# Patient Record
Sex: Female | Born: 1975 | Race: White | Hispanic: No | Marital: Married | Smoking: Never smoker
Health system: Southern US, Community
[De-identification: ages and names within clinical notes are randomized; demographics above are authoritative.]

## PROBLEM LIST (undated history)

## (undated) DIAGNOSIS — Z8041 Family history of malignant neoplasm of ovary: Secondary | ICD-10-CM

## (undated) DIAGNOSIS — O34219 Maternal care for unspecified type scar from previous cesarean delivery: Secondary | ICD-10-CM

## (undated) DIAGNOSIS — R87619 Unspecified abnormal cytological findings in specimens from cervix uteri: Secondary | ICD-10-CM

## (undated) DIAGNOSIS — D649 Anemia, unspecified: Secondary | ICD-10-CM

## (undated) DIAGNOSIS — IMO0002 Reserved for concepts with insufficient information to code with codable children: Secondary | ICD-10-CM

## (undated) DIAGNOSIS — Z803 Family history of malignant neoplasm of breast: Secondary | ICD-10-CM

## (undated) HISTORY — DX: Family history of malignant neoplasm of ovary: Z80.41

## (undated) HISTORY — PX: WISDOM TOOTH EXTRACTION: SHX21

## (undated) HISTORY — PX: LEEP: SHX91

## (undated) HISTORY — PX: DILATION AND CURETTAGE OF UTERUS: SHX78

## (undated) HISTORY — DX: Family history of malignant neoplasm of breast: Z80.3

---

## 1998-11-02 ENCOUNTER — Ambulatory Visit (HOSPITAL_BASED_OUTPATIENT_CLINIC_OR_DEPARTMENT_OTHER): Admission: RE | Admit: 1998-11-02 | Discharge: 1998-11-02 | Payer: Self-pay | Admitting: Plastic Surgery

## 2000-01-08 ENCOUNTER — Other Ambulatory Visit: Admission: RE | Admit: 2000-01-08 | Discharge: 2000-01-08 | Payer: Self-pay | Admitting: *Deleted

## 2000-01-09 ENCOUNTER — Encounter (INDEPENDENT_AMBULATORY_CARE_PROVIDER_SITE_OTHER): Payer: Self-pay

## 2000-01-09 ENCOUNTER — Other Ambulatory Visit: Admission: RE | Admit: 2000-01-09 | Discharge: 2000-01-09 | Payer: Self-pay | Admitting: Obstetrics and Gynecology

## 2000-02-15 ENCOUNTER — Encounter (INDEPENDENT_AMBULATORY_CARE_PROVIDER_SITE_OTHER): Payer: Self-pay

## 2000-02-15 ENCOUNTER — Other Ambulatory Visit: Admission: RE | Admit: 2000-02-15 | Discharge: 2000-02-15 | Payer: Self-pay | Admitting: *Deleted

## 2006-04-29 HISTORY — PX: DILATION AND CURETTAGE OF UTERUS: SHX78

## 2007-02-12 ENCOUNTER — Ambulatory Visit (HOSPITAL_COMMUNITY): Admission: RE | Admit: 2007-02-12 | Discharge: 2007-02-12 | Payer: Self-pay | Admitting: Obstetrics and Gynecology

## 2007-02-12 ENCOUNTER — Encounter (INDEPENDENT_AMBULATORY_CARE_PROVIDER_SITE_OTHER): Payer: Self-pay | Admitting: Obstetrics and Gynecology

## 2007-06-10 ENCOUNTER — Ambulatory Visit (HOSPITAL_COMMUNITY): Admission: RE | Admit: 2007-06-10 | Discharge: 2007-06-10 | Payer: Self-pay | Admitting: Obstetrics and Gynecology

## 2008-04-13 ENCOUNTER — Inpatient Hospital Stay (HOSPITAL_COMMUNITY): Admission: AD | Admit: 2008-04-13 | Discharge: 2008-04-15 | Payer: Self-pay | Admitting: Obstetrics and Gynecology

## 2010-04-29 NOTE — L&D Delivery Note (Signed)
Delivery Note At 3:57 AM a viable and healthy female was delivered via Vaginal, Spontaneous Delivery (Presentation: Right Occiput Anterior).  APGAR: 8, 9; weight .   Placenta status: Intact, Pathology.  Cord: 3 vessels with the following complications: None.  Cord pH: none. CAN x 1 reducible. Light  mec fluid.   Anesthesia: Local  Episiotomy: None Lacerations: 3rd degree Suture Repair: 3.0 chromic vicryl O x 4 Est. Blood Loss (mL): 250cc  Mom to postpartum.  Baby to nursery-stable.  Antanisha Mohs A 12/16/2010, 4:34 AM

## 2010-05-16 ENCOUNTER — Other Ambulatory Visit: Payer: Self-pay | Admitting: Obstetrics and Gynecology

## 2010-06-29 IMAGING — CR DG CYSTOGRAM 3+V
1 series · 1 of 1 positions shown · non-contrast
Comparison: None

CLINICAL DATA: Post C-section on 04/13/2008.  Evaluate for bladder
leak.

CYSTOURETHROGRAM
TECHNIQUE: After catheterization of the urinary bladder following
sterile technique the bladder was filled with 300 cc Cysto-hypaque
30% by drip infusion.  Serial spot images were obtained during
bladder filling .
Fluoroscopy Time: 4.3 minutes

[view not recorded]
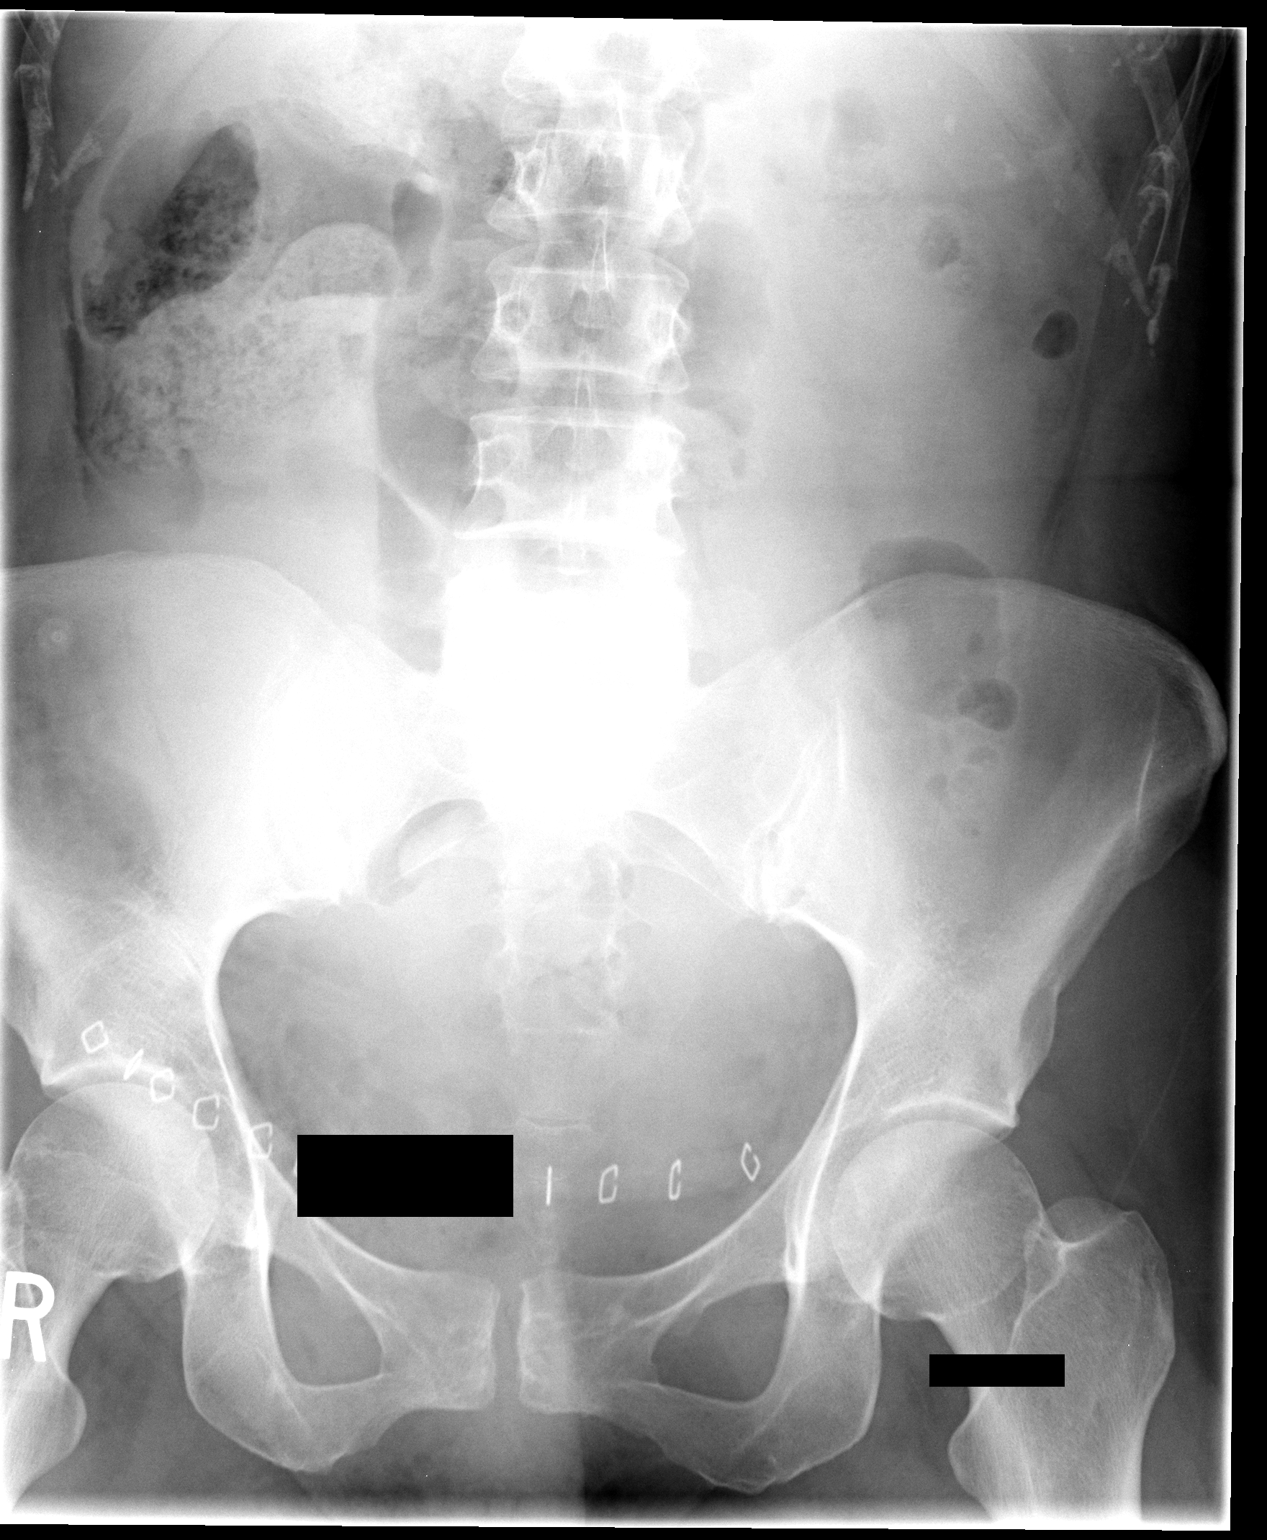

[1 of 1 positions shown; findings below may reference images not displayed]

FINDINGS: On the initial scout film, surgical clips are identified
over the lower pelvis compatible the patient's recent cesarean
section.  No abnormal calcifications or focal bony abnormalities
are seen.

Early filling and late filling films of the bladder reveals a
normal bladder contour.  A small amount of air was introduced into
the bladder during the installation process.  No evidence for
contrast leakage was seen with the patient experiencing a sensation
of fullness at a capacity of 300 ml.  No evidence for contrast was
seen within the pelvis after passive drainage of the bladder
through the instilled catheter to suggest an unrecognized area of
leakage. Complete emptying of the bladder was achieved.
IMPRESSION: Normal cystogram with no evidence for leakage from the bladder.

## 2010-09-11 NOTE — Op Note (Signed)
[name], [name]              ACCOUNT NO.:  [id number]   MEDICAL RECORD NO.:  [id number]          PATIENT TYPE:  INP   LOCATION:  9199                          FACILITY:  [facility]   PHYSICIAN:  Maxie Better, M.D.DATE OF BIRTH:  [birth year]   DATE OF PROCEDURE:  04/13/2008  DATE OF DISCHARGE:                               OPERATIVE REPORT   PREOPERATIVE DIAGNOSIS:  Arrest of descent, postdates.   PROCEDURES:  1. Primary cesarean section, Kerr hysterotomy.  2. Cystoscopy by urologist.   POSTOPERATIVE DIAGNOSES:  1. Arrest of descent, right occiput posterior presentation, postdates.  2. Bladder contusion.   ANESTHESIA:  Epidural.   SURGEON:  Maxie Better, MD   ASSISTANT:  None.   INTRAOPERATIVE CONSULT:  Scott A. MacDiarmid, MD   PROCEDURE:  Under adequate epidural anesthesia, the patient was placed  in a supine position with a left lateral tilt and indwelling Foley  catheter was replaced in the operating room.  A slight blood tinged  urine was noted at that time.  The patient was sterilely prepped and  draped in the usual fashion.  A 0.25% Marcaine was injected along the  planned Pfannenstiel skin incision site.  Pfannenstiel skin incision was  then made, carried down to the rectus fascia.  Rectus fascia was opened  transversely.  Rectus fascia was then bluntly and sharply dissected off  the rectus muscle in superior and inferior fashion.  The rectus muscle  was split in the midline.  The parietal peritoneum was then opened  sharply and extended.  There was some edema in the lower uterine  segment.  The bladder flap was then opened transversely.  The bladder  had minimal descent.  A curvilinear low transverse uterine incision was  then made and extended with bandage scissors.  The baby was large in the  pelvis.  After dislodging from its presentation, vacuum was then used to  help to assist with the delivery.  Baby was bulb suctioned.  The  abdominal cord was  clamped and cut.  The baby was transferred to the  awaiting pediatrician with assigned Apgars of 9 and 9 at 1 and 5  minutes.  The uterus was then exteriorized.  The uterine incision had an  extension at the left angle inferiorly with active bleeding noted.  Sutures of 0 Monocryl was placed, and the incision then closed with 0  Monocryl running locked stitch first layer and second layer was  imbricated using 0 Monocryl suture.  While the second layer was being  closed, it was then noted that there was gross bloody urine from the  catheter.  At that point given the location of the extension on the  left, decision was made to have an intraoperative consultation with the  Urology, Dr. Sherron Monday was the on-call physician for that practice.  Normal tubes and ovaries were identified.  Indigo carmine was injected  intravenously while waiting for his presence.  The blue dye was noted in  the bladder catheter and none was noted in the abdominal field.  Dr.  Sherron Monday arrived and performed a cystoscopy and evaluated abdominally  as  well.  Please see his dictation for the specific details.  After  realizing that the bleeding appears to from the midline contusion of the  bladder from prolonged pushing, the uterus was then returned to the  abdomen.  The abdomen was then copiously irrigated.  The bladder had  been distended prior to closure and no leakage of second dose of indigo  carmine was noted in the abdomen at all.  The paracolic gutters were  then cleaned of debris.  Abdomen was copiously irrigated and suctioned.  Parietal peritoneum was closed with 2-0 Vicryl.  The rectus fascia was  closed with 0 Vicryl x2.  The subcutaneous areas irrigated, small  bleeders cauterized, interrupted 2-0 plain sutures placed, and the skin  approximated using Ethicon staples.   SPECIMENS:  Placenta not sent to pathology.   ESTIMATED BLOOD LOSS:  800 mL.   URINE OUTPUT:  500 mL.   INTRAOPERATIVE FLUID:  2800  mL.   COUNTS:  Sponge and instrument counts x2 was correct.   COMPLICATION:  None.   DISPOSITION:  The patient tolerated the procedure well, was transferred  to the recovery in stable condition.   Weight of the baby was 9 pounds 10 ounces.      Maxie Better, M.D.  Electronically Signed     [place]/MEDQ  D:  04/13/2008  T:  04/14/2008  Job:  [id number]

## 2010-09-11 NOTE — Op Note (Signed)
[name], [name]              ACCOUNT NO.:  [id number]   MEDICAL RECORD NO.:  [id number]          PATIENT TYPE:  INP   LOCATION:  9165                          FACILITY:  [facility]   PHYSICIAN:  Martina Sinner, MD DATE OF BIRTH:  12-06-1975   DATE OF PROCEDURE:  04/13/2008  DATE OF DISCHARGE:                               OPERATIVE REPORT   PREOPERATIVE DIAGNOSIS:  Gross hematuria.   POSTOPERATIVE DIAGNOSIS:  Bladder contusion.   SURGERY:  Cystoscopy and inspection of bladder and pelvis for urine  leak.   [name] had a cesarean section by Dr. Maxie Better who  called me because there was gross blood in the catheter.  Her dissection  when she did the cesarean section took her a little bit lateral on the  left side, and she was concerned she may have injured the left ureter.   When I entered the operating room, the patient was stable and awake and  utilizing a spinal anesthetic.  The uterus was delivered out through her  small Pfannenstiel incision.  The bladder was easily palpable and  visualized in the retropubic space.  There was no obvious injury, and I  could locate the Foley catheter balloon.   Dr. Cherly Hensen showed me the area on the left side that she was a little  bit concerned of, and in my opinion, it looked like the uterine artery,  and I felt that the ureter would have been much deeper in the pelvis.  There was no blue dye in the left hemi pelvis, and indigo carmine had  been given 20 minutes prior.   We took time, and in a sterile technique, the patient was placed in  lithotomy position, and I prepped her vaginal area and draped her for  cystoscopy.   A 20-French scope with 3-degree lens was utilized.  Under direct  visualization, one could see a midline elevation almost like a camel  hump with was I thought were two ureteral orifices, one on each side  with edema of the mucosa in the midline and around the ureters.  The  rest the mucosa looked  normal.  It was an odd finding, and I felt was  likely due to the baby pushing and trying to be delivered vaginally.   More indigo carmine was given, and bilateral ureteral jets were seen  through the 2 areas in question.  There was no other injury.  She had a  long urethra, though it was capacious, and she leaked under anesthesia.  There was no palpable or visual injury to the bladder or urethra  transvaginally.   I then inspected the bladder abdominally, and again there was no obvious  leak or blue dye in the retropubic space or deep in the left hemi  pelvis.   My working diagnosis was contusion of the bladder.  I placed a 20-French  catheter just in case she had bleeding postprocedure.  We will low-  volume cystogram tomorrow or the next day to rule out an occult bladder  leak.   In my opinion, the patient did not require further identification  of the  ureter which would meant extending the incision and a lot more surgery  because of the anatomic findings.           ______________________________  Martina Sinner, MD  Electronically Signed     SAM/MEDQ  D:  04/13/2008  T:  04/15/2008  Job:  [id number]

## 2010-09-11 NOTE — Op Note (Signed)
NAMEALMADELIA, [name]              ACCOUNT NO.:  [id number]   MEDICAL RECORD NO.:  [id number]          PATIENT TYPE:  AMB   LOCATION:  SDC                           [facility]ACILITY:  [facility]   PHYSICIAN:  Maxie Better, M.D.DATE OF BIRTH:  [birth year]   DATE OF PROCEDURE:  02/12/2007  DATE OF DISCHARGE:                               OPERATIVE REPORT   PREOPERATIVE DIAGNOSIS:  Missed abortion.   PROCEDURE:  Ultrasound-guided suction dilation and evacuation.   POSTOPERATIVE DIAGNOSIS:  Missed abortion.   ANESTHESIA:  MAC paracervical block.   SURGEON:  Maxie Better, M.D.   DESCRIPTION OF PROCEDURE:  Under adequate monitored anesthesia, the  patient is placed in the dorsal lithotomy position.  She was sterilely  prepped and draped in the usual fashion.  Bladder was not catheterized  due to the planned ultrasound to facilitate the procedure.  Examination  under anesthesia revealed about an 8-week size uterus.  A bivalve  speculum was placed in the vagina.  Then 20 mL of 1% Nesacaine was  injected paracervically at 3 and 9 o'clock.  The anterior lip of the  cervix was grasped with a single-tooth tenaculum.  The cervix was then  serially dilated up to #27 [facility] dilator.  A number 7 mm curved suction  cannula was introduced into the uterine cavity under ultrasound-  guidance.   The cavity which had evidence of a fluid-filled sac was then suctioned,  and subsequently curetted until all tissue was felt to be removed, at  which time all instruments were then removed from the vagina.  Specimen  labeled products of conception were sent to pathology.  Estimated blood  loss was minimal.  Complications were none.  Maternal blood type is O+.  The patient tolerated the procedure well, and was transferred to  recovery room in stable condition.      Maxie Better, M.D.  Electronically Signed     [place]/MEDQ  D:  02/12/2007  T:  02/13/2007  Job:  [id number]

## 2010-09-14 NOTE — Discharge Summary (Signed)
[name], [name]              ACCOUNT NO.:  [id number]   MEDICAL RECORD NO.:  [id number]          PATIENT TYPE:  INP   LOCATION:  9133                          [facility]ACILITY:  [facility]   PHYSICIAN:  Maxie Better, M.D.DATE OF BIRTH:  Apr 07, 1976   DATE OF ADMISSION:  04/13/2008  DATE OF DISCHARGE:  04/15/2008                               DISCHARGE SUMMARY   ADMISSION DIAGNOSIS:  Early labor, post dates.   DISCHARGE DIAGNOSES:  Post dates delivered, arrest of descent, bladder  contusion, postoperative anemia.   PROCEDURE:  Primary cesarean section, Kerr hysterotomy, cystoscopy by  Dr. Jacquelyne Balint.   HISTORY OF PRESENT ILLNESS:  A 35 year old gravida 4, para 0-0-3-0  married white female at 14-6/7th weeks admitted on April 13, 2008, in  early labor.  The patient was already scheduled for induction of labor  later that night.  She presented to maternity admissions with complaints  of contractions.  The tracing was reactive.  Her exam was 160%.  She  then ambulated and progressive 3 cm, 85%, -1 per the RN.  The prenatal  course was otherwise unremarkable.  Ultrasound had shown estimated fetal  weight of 9 pounds 3 ounces and normal amniotic fluid.  The patient had  had a normal one-hour glucose tolerance test.   HOSPITAL COURSE:  The patient was admitted to the labor unit.  She had  contractions every 2-4 minutes.  She progressed to 5, 90%, -1.  She  subsequently had artificial rupture of membranes with clear fluid.  Exam  was thought to be right occiput posterior presentation with some  asynclitism.  At that time, she was 690, -1, tracing was reactive,  contractions were irregular.  She had a protracted/arrest of dilatation.  The patient had Pitocin augmentation.  Intrauterine pressure catheter  placed and she was placed in exaggerated Cecilio Asper' position.  The patient  subsequently progressed to full dilatation.  She was +2 station with a  small caput, contractions every 1-1/2 to 2  minutes.  The patient had  been on penicillin for group B strep positive and she continued to push.  The patient pushed for 3 hours, the catheter remained unchanged.  The  patient was not amenable to vaginal delivery and she was having deep  variable deceleration with pushing.  Decision was made to proceed with a  primary cesarean section.  The patient was therefore taken to the  operating room.  Please see the dictated operative report for specific  details.  The patient delivered a live female, right occiput posterior  presentation, 9 pounds 10 ounces, normal tubes and ovaries, Apgars of 9  and 9.  During the time of the surgery, gross hematuria was noted, which  resulted in an intraoperative consultation with urologist on-call Dr.  Jacquelyne Balint, who kindly came in and evaluated the patient including a  cystoscopy.  He has the dictated operative report as well.  The findings  were consistent with bladder contusion due to the pushing for the  prolonged period of time.  Ureteral jets were noted bilaterally.  The  patient subsequently had postoperatively cystogram that was normal and  her Foley catheter was subsequently discontinued.  She continued to do  well thereafter.  Her CBC on postop day #1 showed a hemoglobin of 10.6,  hematocrit of 30.4, white count of 27.5, platelet count 236,000.  The  patient remained afebrile throughout her course.  Her had no evidence of  an infection and she desired to go home.   DISPOSITION:  Home.   CONDITION:  Stable.   DISCHARGE MEDICATIONS:  1. Percocet 1-2 tablets every 4-6 hours p.r.n. pain.  2. Motrin 800 mg every 8 hours p.r.n. pain.  3. Prenatal vitamins one p.o. daily.   FOLLOWUP:  Followup appointment at [facility] for staple removal on  Monday or Tuesday and for routine 6 weeks postpartum appointment in 6  weeks.  Discharge instructions per the postpartum booklet given.      Maxie Better, M.D.  Electronically Signed      [place]/MEDQ  D:  05/15/2008  T:  05/15/2008  Job:  [id number]

## 2010-12-16 ENCOUNTER — Encounter (HOSPITAL_COMMUNITY): Payer: Self-pay | Admitting: Anesthesiology

## 2010-12-16 ENCOUNTER — Inpatient Hospital Stay (HOSPITAL_COMMUNITY)
Admission: AD | Admit: 2010-12-16 | Discharge: 2010-12-17 | DRG: 373 | Disposition: A | Discharge status: Home / Self Care | Payer: BC Managed Care – PPO | Source: Ambulatory Visit | Attending: Obstetrics and Gynecology | Admitting: Obstetrics and Gynecology

## 2010-12-16 ENCOUNTER — Encounter (HOSPITAL_COMMUNITY): Payer: Self-pay | Admitting: *Deleted

## 2010-12-16 ENCOUNTER — Other Ambulatory Visit: Payer: Self-pay | Admitting: Obstetrics and Gynecology

## 2010-12-16 DIAGNOSIS — IMO0001 Reserved for inherently not codable concepts without codable children: Secondary | ICD-10-CM

## 2010-12-16 DIAGNOSIS — O99892 Other specified diseases and conditions complicating childbirth: Secondary | ICD-10-CM | POA: Diagnosis present

## 2010-12-16 DIAGNOSIS — O34219 Maternal care for unspecified type scar from previous cesarean delivery: Secondary | ICD-10-CM | POA: Diagnosis present

## 2010-12-16 DIAGNOSIS — Z22330 Carrier of Group B streptococcus: Secondary | ICD-10-CM

## 2010-12-16 HISTORY — DX: Anemia, unspecified: D64.9

## 2010-12-16 HISTORY — DX: Reserved for concepts with insufficient information to code with codable children: IMO0002

## 2010-12-16 HISTORY — DX: Unspecified abnormal cytological findings in specimens from cervix uteri: R87.619

## 2010-12-16 LAB — RPR
RPR Ser Ql: NONREACTIVE
RPR: NONREACTIVE

## 2010-12-16 LAB — CBC
HCT: 37.8 % (ref 36.0–46.0)
MCH: 31.9 pg (ref 26.0–34.0)
MCHC: 34.9 g/dL (ref 30.0–36.0)
RDW: 12.9 % (ref 11.5–15.5)

## 2010-12-16 LAB — HEPATITIS B SURFACE ANTIGEN: Hepatitis B Surface Ag: NEGATIVE

## 2010-12-16 LAB — STREP B DNA PROBE: GBS: POSITIVE

## 2010-12-16 LAB — ANTIBODY SCREEN: Antibody Screen: NEGATIVE

## 2010-12-16 MED ORDER — DIPHENHYDRAMINE HCL 25 MG PO CAPS: 25.0000 mg | ORAL_CAPSULE | Freq: Four times a day (QID) | ORAL | Status: DC | PRN

## 2010-12-16 MED ORDER — LANOLIN HYDROUS EX OINT: TOPICAL_OINTMENT | CUTANEOUS | Status: DC | PRN

## 2010-12-16 MED ORDER — EPHEDRINE 5 MG/ML INJ
10.0000 mg | INTRAVENOUS | Status: DC | PRN
  Filled 2010-12-16: qty 4

## 2010-12-16 MED ORDER — ACETAMINOPHEN 325 MG PO TABS: 650.0000 mg | ORAL_TABLET | ORAL | Status: DC | PRN

## 2010-12-16 MED ORDER — OXYCODONE-ACETAMINOPHEN 5-325 MG PO TABS: 1.0000 | ORAL_TABLET | ORAL | Status: DC | PRN

## 2010-12-16 MED ORDER — BENZOCAINE-MENTHOL 20-0.5 % EX AERO
INHALATION_SPRAY | CUTANEOUS | Status: AC
  Filled 2010-12-16: qty 56

## 2010-12-16 MED ORDER — IBUPROFEN 600 MG PO TABS: 600.0000 mg | ORAL_TABLET | Freq: Four times a day (QID) | ORAL | Status: DC | PRN

## 2010-12-16 MED ORDER — IBUPROFEN 600 MG PO TABS
600.0000 mg | ORAL_TABLET | Freq: Four times a day (QID) | ORAL | Status: DC
  Administered 2010-12-16 – 2010-12-17 (×6): 600 mg via ORAL
  Filled 2010-12-16 (×6): qty 1

## 2010-12-16 MED ORDER — PRENATAL PLUS 27-1 MG PO TABS
1.0000 | ORAL_TABLET | Freq: Every day | ORAL | Status: DC
  Administered 2010-12-16 – 2010-12-17 (×2): 1 via ORAL
  Filled 2010-12-16 (×2): qty 1

## 2010-12-16 MED ORDER — PHENYLEPHRINE 40 MCG/ML (10ML) SYRINGE FOR IV PUSH (FOR BLOOD PRESSURE SUPPORT)
80.0000 ug | PREFILLED_SYRINGE | INTRAVENOUS | Status: DC | PRN
  Filled 2010-12-16: qty 5

## 2010-12-16 MED ORDER — ZOLPIDEM TARTRATE 5 MG PO TABS: 5.0000 mg | ORAL_TABLET | Freq: Every evening | ORAL | Status: DC | PRN

## 2010-12-16 MED ORDER — PENICILLIN G POTASSIUM 5000000 UNITS IJ SOLR
2.5000 10*6.[IU] | INTRAVENOUS | Status: DC
  Filled 2010-12-16 (×3): qty 2.5

## 2010-12-16 MED ORDER — LACTATED RINGERS IV SOLN
500.0000 mL | Freq: Once | INTRAVENOUS | Status: AC
  Administered 2010-12-16: 1000 mL via INTRAVENOUS

## 2010-12-16 MED ORDER — PHENYLEPHRINE 40 MCG/ML (10ML) SYRINGE FOR IV PUSH (FOR BLOOD PRESSURE SUPPORT): 80.0000 ug | PREFILLED_SYRINGE | INTRAVENOUS | Status: DC | PRN

## 2010-12-16 MED ORDER — ONDANSETRON HCL 4 MG/2ML IJ SOLN: 4.0000 mg | Freq: Four times a day (QID) | INTRAMUSCULAR | Status: DC | PRN

## 2010-12-16 MED ORDER — PENICILLIN G POTASSIUM 5000000 UNITS IJ SOLR
5.0000 10*6.[IU] | Freq: Once | INTRAVENOUS | Status: DC
  Administered 2010-12-16: 5 10*6.[IU] via INTRAVENOUS
  Filled 2010-12-16: qty 5

## 2010-12-16 MED ORDER — CITRIC ACID-SODIUM CITRATE 334-500 MG/5ML PO SOLN
30.0000 mL | ORAL | Status: DC | PRN
  Administered 2010-12-16: 30 mL via ORAL
  Filled 2010-12-16: qty 15

## 2010-12-16 MED ORDER — DIBUCAINE 1 % RE OINT
1.0000 "application " | TOPICAL_OINTMENT | RECTAL | Status: DC | PRN
  Administered 2010-12-16: 1 via RECTAL
  Filled 2010-12-16 (×2): qty 28

## 2010-12-16 MED ORDER — DIPHENHYDRAMINE HCL 50 MG/ML IJ SOLN: 12.5000 mg | INTRAMUSCULAR | Status: DC | PRN

## 2010-12-16 MED ORDER — OXYTOCIN 20 UNITS IN LACTATED RINGERS INFUSION - SIMPLE
125.0000 mL/h | INTRAVENOUS | Status: DC
  Administered 2010-12-16: 500 mL/h via INTRAVENOUS

## 2010-12-16 MED ORDER — OXYTOCIN BOLUS FROM INFUSION
500.0000 mL | Freq: Once | INTRAVENOUS | Status: DC
  Filled 2010-12-16: qty 500
  Filled 2010-12-16: qty 1000

## 2010-12-16 MED ORDER — LIDOCAINE HCL (PF) 1 % IJ SOLN
30.0000 mL | INTRAMUSCULAR | Status: DC | PRN
  Administered 2010-12-16: 30 mL via SUBCUTANEOUS
  Filled 2010-12-16: qty 30

## 2010-12-16 MED ORDER — LACTATED RINGERS IV SOLN: 500.0000 mL | INTRAVENOUS | Status: DC | PRN

## 2010-12-16 MED ORDER — ONDANSETRON HCL 4 MG/2ML IJ SOLN: 4.0000 mg | INTRAMUSCULAR | Status: DC | PRN

## 2010-12-16 MED ORDER — SENNOSIDES-DOCUSATE SODIUM 8.6-50 MG PO TABS: 2.0000 | ORAL_TABLET | Freq: Every day | ORAL | Status: DC

## 2010-12-16 MED ORDER — WITCH HAZEL-GLYCERIN EX PADS
1.0000 "application " | MEDICATED_PAD | CUTANEOUS | Status: DC | PRN
  Administered 2010-12-16: 1 via TOPICAL

## 2010-12-16 MED ORDER — FENTANYL 2.5 MCG/ML BUPIVACAINE 1/10 % EPIDURAL INFUSION (WH - ANES)
14.0000 mL/h | INTRAMUSCULAR | Status: DC
  Filled 2010-12-16: qty 60

## 2010-12-16 MED ORDER — OXYCODONE-ACETAMINOPHEN 5-325 MG PO TABS: 2.0000 | ORAL_TABLET | ORAL | Status: DC | PRN

## 2010-12-16 MED ORDER — FLEET ENEMA 7-19 GM/118ML RE ENEM: 1.0000 | ENEMA | RECTAL | Status: DC | PRN

## 2010-12-16 MED ORDER — EPHEDRINE 5 MG/ML INJ: 10.0000 mg | INTRAVENOUS | Status: DC | PRN

## 2010-12-16 MED ORDER — BENZOCAINE-MENTHOL 20-0.5 % EX AERO
1.0000 "application " | INHALATION_SPRAY | CUTANEOUS | Status: DC | PRN
  Filled 2010-12-16: qty 56

## 2010-12-16 MED ORDER — SIMETHICONE 80 MG PO CHEW: 80.0000 mg | CHEWABLE_TABLET | ORAL | Status: DC | PRN

## 2010-12-16 MED ORDER — ONDANSETRON HCL 4 MG PO TABS: 4.0000 mg | ORAL_TABLET | ORAL | Status: DC | PRN

## 2010-12-16 MED ORDER — LACTATED RINGERS IV SOLN
INTRAVENOUS | Status: DC
  Administered 2010-12-16: 02:00:00 via INTRAVENOUS

## 2010-12-16 NOTE — Anesthesia Preprocedure Evaluation (Deleted)
Anesthesia Evaluation  Name, MR# and DOB Patient awake  General Assessment Comment  Reviewed: Allergy & Precautions, H&P , Patient's Chart, lab work & pertinent test results  Airway Mallampati: II TM Distance: >3 FB Neck ROM: full    Dental  (+) Teeth Intact   Pulmonary  clear to auscultation  breath sounds clear to auscultation none    Cardiovascular regular Normal    Neuro/Psych   GI/Hepatic/Renal   Endo/Other    Abdominal   Musculoskeletal   Hematology   Peds  Reproductive/Obstetrics (+) Pregnancy    Anesthesia Other Findings                 Anesthesia Physical Anesthesia Plan  ASA: II  Anesthesia Plan: Epidural   Post-op Pain Management:    Induction:   Airway Management Planned:   Additional Equipment:   Intra-op Plan:   Post-operative Plan:   Informed Consent: I have reviewed the patients History and Physical, chart, labs and discussed the procedure including the risks, benefits and alternatives for the proposed anesthesia with the patient or authorized representative who has indicated his/her understanding and acceptance.   Dental Advisory Given  Plan Discussed with: CRNA and Surgeon  Anesthesia Plan Comments: (VBAC  Labs checked- platelets confirmed with RN in room. Fetal heart tracing, per RN, reportedly stable enough for sitting procedure. Discussed epidural, and patient consents to the procedure:  included risk of possible headache,backache, failed block, allergic reaction, and nerve injury. This patient was asked if she had any questions or concerns before the procedure started. )        Anesthesia Quick Evaluation

## 2010-12-16 NOTE — Progress Notes (Signed)
  S: Pt c/o urge to push. Epidural planned. PCN IV dose x 1  O: fully dilated (+) 2 station intact  Tracing reassuring  A/p: Complete. Prev LTCS GBS cx (+)  P) anticipate successful TOL

## 2010-12-16 NOTE — Progress Notes (Signed)
Pt states, " I've had regular contractions since 7 pm, and now they are every 2-3 min and I'm having some brownish discharge."

## 2010-12-16 NOTE — H&P (Signed)
[name] is a 35 y.o. female presenting in active labor. Prev LTCS desires VBAC. Uncomplicated PNC. (+) GBS.  Maternal Medical History:  Reason for admission: Reason for admission: contractions.  Contractions: Frequency: regular.   Perceived severity is moderate.    Fetal activity: Perceived fetal activity is normal.    Prenatal complications: no prenatal complications   OB History    Grav Para Term Preterm Abortions TAB SAB Ect Mult Living   5 1 1  3 1 2   1      Past Medical History  Diagnosis Date  . Abnormal Pap smear   . Anemia    Past Surgical History  Procedure Date  . Cesarean section   . Wisdom tooth extraction   . Dilation and curettage of uterus   . Leep   . Dilation and curettage of uterus 2008   Family History: family history is not on file. Social History:  reports that she has never smoked. She does not have any smokeless tobacco history on file. She reports that she does not drink alcohol or use illicit drugs.  ROS neg  Dilation: 5.5 Effacement (%): 80 Station: -2 Exam by:: A.Davis,RN Blood pressure 132/84, pulse 74, temperature 97.9 F (36.6 C), temperature source Oral, resp. rate 18, height 5' 4.25" (1.632 m), weight 83.519 kg (184 lb 2 oz), SpO2 100.00%. Exam Physical Exam  Constitutional: She is oriented to person, place, and time. She appears well-developed and well-nourished.  HENT:  Head: Normocephalic and atraumatic.  Eyes: EOM are normal.  Neck: Neck supple.  Cardiovascular: Regular rhythm.   Respiratory: Breath sounds normal.  GI: Bowel sounds are normal.  Neurological: She is alert and oriented to person, place, and time.  Skin: Skin is warm and dry.    Prenatal labs: ABO, Rh: O/Positive/-- (08/19 0140) Antibody: Negative (08/19 0140) Rubella:  Immune RPR: Nonreactive (08/19 0140)  HBsAg: Negative (08/19 0140)  HIV: Non-reactive (08/19 0140)  GBS: Positive (08/19 0140)   Assessment/Plan Active Previous LTCS Term  gestation GBS cx (+)  P) Admit. Analgesic prn, routine labs. PCN prophylaxis  [name] A 12/16/2010, 4:30 AM

## 2010-12-16 NOTE — Progress Notes (Signed)
[name] is a 35 y.o. G5P1031 at [redacted]w[redacted]d by ultrasound admitted for active labor  Subjective:   Objective: BP 132/84  Pulse 74  Temp(Src) 97.9 F (36.6 C) (Oral)  Resp 18  Ht 5' 4.25" (1.632 m)  Wt 83.519 kg (184 lb 2 oz)  BMI 31.36 kg/m2  SpO2 100%      FHT:  FHR: 140 bpm, variability: moderate,  accelerations:  Present,  decelerations:  Absent UC:   regular, every 4 minutes SVE:   Dilation: 5.5 Effacement (%): 80 Station: -2 Exam by:: A.Davis,RN  Labs: Lab Results  Component Value Date   WBC 15.6* 12/16/2010   HGB 13.2 12/16/2010   HCT 37.8 12/16/2010   MCV 91.3 12/16/2010   PLT 175 12/16/2010    Assessment / Plan: Spontaneous labor, progressing normally  Labor: Progressing normally Preeclampsia:  no signs or symptoms of toxicity Fetal Wellbeing:  Category I Pain Control:  Labor support without medications I/D:  n/a Anticipated MOD:  NSVD  [name] A 12/16/2010, 4:35 AM

## 2010-12-17 LAB — CBC
HCT: 36.6 % (ref 36.0–46.0)
Hemoglobin: 12.6 g/dL (ref 12.0–15.0)
MCH: 31.9 pg (ref 26.0–34.0)
MCHC: 34.4 g/dL (ref 30.0–36.0)

## 2010-12-17 MED ORDER — WITCH HAZEL-GLYCERIN EX PADS: 1.0000 "application " | MEDICATED_PAD | CUTANEOUS | Status: AC | PRN

## 2010-12-17 MED ORDER — IBUPROFEN 600 MG PO TABS: 600.0000 mg | ORAL_TABLET | Freq: Four times a day (QID) | ORAL | Status: AC

## 2010-12-17 MED ORDER — DIBUCAINE 1 % RE OINT: 1.0000 "application " | TOPICAL_OINTMENT | RECTAL | Status: DC | PRN

## 2010-12-17 NOTE — Progress Notes (Signed)
Post Partum Day #1             Subjective:   Pt reports feeling well/ Tolerating po/ Voiding without problems/ No n/v/ Bleeding is light/ Pain controlled with prescription NSAID's including ibuprofen (Motrin)  Newborn info:  Information for the patient's newborn:  Mackensie, Pilson [[id number]]  female  / circumcision done     Objective:     VS: Blood pressure 110/70, pulse 73, temperature 98.2 F (36.8 C), temperature source Oral, resp. rate 18, height 5' 4.25" (1.632 m), weight 184 lb 2 oz (83.519 kg), SpO2 100.00%, unknown if currently breastfeeding.  No intake or output data in the 24 hours ending 12/17/10 1013      Basename 12/17/10 0930 12/16/10 0134  WBC 9.9 15.6*  HGB 12.6 13.2  HCT 36.6 37.8  PLT 123* 175     Blood type: O/Positive/-- (08/19 0140)  Rubella: Immune (08/19 0140)     Physical Exam:   A & O x 3 NAD   Lungs: CTAB  Heart: RR  Abdomen: soft, non-tender, FF @ U  Perineum: repair intact, edema none  Lochia: small  Extremities: neg Homans', edema none    Assessment/Plan:  PPD # 1 / 35 y.o., [id number] S/P: VBAC    normal postpartum exam  Doing well  Continue routine post partum orders  Desires early d/c, plan D/C home this PM pending NB d/c by peds         LOS: 1 day   PAUL,DANIELA, CNM, MSN 12/17/2010, 10:13 AM

## 2010-12-17 NOTE — Discharge Summary (Signed)
Obstetric Discharge Summary Reason for Admission: onset of labor Prenatal Procedures: none Intrapartum Procedures: spontaneous vaginal delivery after cesarean Postpartum Procedures: none Complications-Operative and Postpartum: 3rd degree perineal laceration  Hemoglobin  Date Value Range Status  12/17/2010 12.6  12.0-15.0 (g/dL) Final     HCT  Date Value Range Status  12/17/2010 36.6  36.0-46.0 (%) Final    Discharge Diagnoses: Term Pregnancy-delivered  Discharge Information: Date: 12/17/2010 Activity: pelvic rest Diet: routine Medications: Ibuprophen Condition: stable Instructions: refer to routine discharge instructions Discharge to: home Follow-up Information    Follow up with [facility] in 6 weeks.   Contact information:   [address] [place] [address] [phone]          Newborn Data: Live born  Information for the patient's newborn:  Breah, Joa [[id number]]  female APGAR 8/9;  ; weight 7-15;  Home with mother.    PAUL,DANIELA 12/17/2010, 10:19 AM

## 2010-12-18 NOTE — Anesthesia Procedure Notes (Deleted)
Procedures

## 2010-12-19 NOTE — Anesthesia Postprocedure Evaluation (Deleted)
  Anesthesia Post-op Note  Patient: [name] This patient has recovered from her labor epidural, and I am not aware of any complications or problems.

## 2011-01-10 ENCOUNTER — Inpatient Hospital Stay (HOSPITAL_COMMUNITY): Admission: RE | Admit: 2011-01-10 | Discharge: 2011-01-10 | Payer: BC Managed Care – PPO | Source: Ambulatory Visit

## 2011-01-10 NOTE — Progress Notes (Signed)
Adult Lactation Consultation Outpatient Visit Note  Patient Name: [name] Date of Birth: Dec 14, 1975 Gestational Age at Delivery: Unknown Type of Delivery: Vaginal Delivery   Breastfeeding History: Frequency of Breastfeeding: 10 plus - per mom 10-46min ( per mom usually feeding 10 mins and switching breast ( encouraged to feed on 1st breast 15 -20 (empty 1st breast well and offer 2nd breast ) .   Length of Feeding:  Voids: 8 Stools:4 yellowish seedy    Supplementing / Method: Pumping:  Type of Pump:DEBP -Medela    Frequency:PRN   Volume:  Up to 30 min   Comments: Mom , started pumping after feedings , Concerns - The EBM yield is low . @Consult  reviewed  pumping !) when pumping avoid looking at the bottles .(Cover up ) 2) Pump after 2-3 feedings per day . Check pressure on mom's pump 250-300 both sides and double . After infant fed ,mom pumped so LC could check flanges . 24 flange to start with ,noted with the let-down feature ,tissue became pinky -red ,EBM yield = 15ml pumped approx 8-34min .   Consultation Evaluation:  Initial Feeding Assessment: Pre-feed Weight:9-9.3 ( 4344g)  Post-feed Weight:9.10.8 ( 4388g)  Amount Transferred:21ml  Comments:right breast , adjusted upper lip to increase depth .  Additional Feeding Assessment: Pre-feed Weight:9.10.8 ( 4388g)  Post-feed Weight:9.11 ( 4394g) Amount Transferred:6 ml ,also Zachery void large at the end of the feeding .  Comments:left breast , minimal assist with depth , consistent pattern with multiply swallow and gulps   Additional Feeding Assessment: Pre-feed Weight:  Post-feed Weight: Amount Transferred: Comments:  Total Breast milk Transferred this Visit: 50ml ,( infant satisfied after feedind ,also ended feeding )  also post pumped 24ml during assessment of flanges . TOTAL + 74ml  Total Supplement Given:   Additional Interventions:  @ consult    Follow-Up : encouraged to come back to the Tues. [facility] at [facility]  ,or call for a weight check with Lactation or Dr.Youngs office . LC recommended going home with her 24 flanges and a 27 flanges and use 27 in am and if feel too loose in pm 24 , Also not to watch the pump pieces when pumping . LC also encouraged to call [facility] office if needing further assistance.       [name], [name] 01/10/2011, 10:59 AM

## 2011-02-01 LAB — CBC
MCHC: 33.9 g/dL (ref 30.0–36.0)
MCHC: 35 g/dL (ref 30.0–36.0)
MCV: 94.6 fL (ref 78.0–100.0)
Platelets: 263 10*3/uL (ref 150–400)
RDW: 13.9 % (ref 11.5–15.5)

## 2011-02-01 LAB — RPR: RPR Ser Ql: NONREACTIVE

## 2011-02-06 LAB — CBC
RBC: 4.48
WBC: 6.9

## 2011-10-01 ENCOUNTER — Ambulatory Visit (INDEPENDENT_AMBULATORY_CARE_PROVIDER_SITE_OTHER): Payer: BC Managed Care – PPO | Admitting: Physician Assistant

## 2011-10-01 VITALS — BP 117/77 | HR 85 | Temp 97.9°F | Resp 16 | Ht 65.0 in | Wt 147.2 lb

## 2011-10-01 DIAGNOSIS — J309 Allergic rhinitis, unspecified: Secondary | ICD-10-CM

## 2011-10-01 DIAGNOSIS — J029 Acute pharyngitis, unspecified: Secondary | ICD-10-CM

## 2011-10-01 DIAGNOSIS — R509 Fever, unspecified: Secondary | ICD-10-CM

## 2011-10-01 LAB — POCT RAPID STREP A (OFFICE): Rapid Strep A Screen: POSITIVE — AB

## 2011-10-01 MED ORDER — PENICILLIN G BENZATHINE 1200000 UNIT/2ML IM SUSP
1.2000 10*6.[IU] | Freq: Once | INTRAMUSCULAR | Status: AC
  Administered 2011-10-01: 1.2 10*6.[IU] via INTRAMUSCULAR

## 2011-10-01 NOTE — Patient Instructions (Signed)

## 2011-10-01 NOTE — Progress Notes (Signed)
  Subjective:    Patient ID: [name], female    DOB: 12/12/1975, 36 y.o.   MRN: [id number]  HPI Patient presents with 2 day history of sore throat. Denies fever but is concerned about the possibility of strep. + sick contacts (husband and son). Also admits to nasal congestion and drainage x 1 week. Has not been taking any medication. Denies otalgia, nausea, vomiting, or sinus pain.     Review of Systems  All other systems reviewed and are negative.       Objective:   Physical Exam  Constitutional: She is oriented to person, place, and time. She appears well-developed and well-nourished.  HENT:  Head: Normocephalic and atraumatic.  Right Ear: Hearing, tympanic membrane, external ear and ear canal normal.  Left Ear: Hearing, tympanic membrane, external ear and ear canal normal.  Mouth/Throat: Uvula is midline. No oropharyngeal exudate.       Bilateral tonsillar erythema  Eyes: Conjunctivae are normal.  Neck: Neck supple.  Cardiovascular: Normal rate, regular rhythm and normal heart sounds.   Pulmonary/Chest: Effort normal and breath sounds normal.  Lymphadenopathy:    She has no cervical adenopathy.  Neurological: She is alert and oriented to person, place, and time.  Psychiatric: She has a normal mood and affect. Her behavior is normal. Judgment and thought content normal.    Results for orders placed in visit on 10/01/11  POCT RAPID STREP A (OFFICE)      Component Value Range   Rapid Strep A Screen Positive (*) Negative          Assessment & Plan:   1. Acute pharyngitis  Recommend ibuprofen or tylenol as needed for pain.  Will treat strep today with Bicillin.  POCT rapid strep A

## 2012-03-31 LAB — OB RESULTS CONSOLE HIV ANTIBODY (ROUTINE TESTING): HIV: NONREACTIVE

## 2012-04-02 ENCOUNTER — Ambulatory Visit (INDEPENDENT_AMBULATORY_CARE_PROVIDER_SITE_OTHER): Payer: BC Managed Care – PPO | Admitting: Licensed Clinical Social Worker

## 2012-04-02 DIAGNOSIS — F4322 Adjustment disorder with anxiety: Secondary | ICD-10-CM

## 2012-04-06 ENCOUNTER — Ambulatory Visit (INDEPENDENT_AMBULATORY_CARE_PROVIDER_SITE_OTHER): Payer: BC Managed Care – PPO | Admitting: Licensed Clinical Social Worker

## 2012-04-06 ENCOUNTER — Ambulatory Visit: Payer: BC Managed Care – PPO | Admitting: Licensed Clinical Social Worker

## 2012-04-06 DIAGNOSIS — F4322 Adjustment disorder with anxiety: Secondary | ICD-10-CM

## 2012-04-13 LAB — OB RESULTS CONSOLE GC/CHLAMYDIA: Gonorrhea: NEGATIVE

## 2012-04-29 HISTORY — DX: Maternal care for unspecified type scar from previous cesarean delivery: O34.219

## 2012-04-29 NOTE — L&D Delivery Note (Signed)
Delivery Note At 12:03 AM a viable and healthy female was delivered via VBAC, Spontaneous Delivery (Presentation: Right Occiput Anterior).  APGAR:8/9 , ; weight .8lb 10 oz Placenta status: Intact, Spontaneous not sent.  Cord:  with the following complications: None.  Cord pH: none  Anesthesia: None  Episiotomy: None Lacerations: None Suture Repair: none Est. Blood Loss (mL): 200  Mom to postpartum.  Baby to nursery-stable.  Paola Aleshire A 10/24/2012, 12:19 AM

## 2012-05-01 ENCOUNTER — Ambulatory Visit (INDEPENDENT_AMBULATORY_CARE_PROVIDER_SITE_OTHER): Payer: 59 | Admitting: Licensed Clinical Social Worker

## 2012-05-01 DIAGNOSIS — F4322 Adjustment disorder with anxiety: Secondary | ICD-10-CM

## 2012-06-03 ENCOUNTER — Ambulatory Visit: Payer: 59 | Admitting: Licensed Clinical Social Worker

## 2012-07-29 LAB — OB RESULTS CONSOLE RPR: RPR: NONREACTIVE

## 2012-09-23 LAB — OB RESULTS CONSOLE GBS: GBS: NEGATIVE

## 2012-10-23 ENCOUNTER — Encounter (HOSPITAL_COMMUNITY): Payer: Self-pay | Admitting: *Deleted

## 2012-10-23 ENCOUNTER — Inpatient Hospital Stay (HOSPITAL_COMMUNITY)
Admission: AD | Admit: 2012-10-23 | Discharge: 2012-10-25 | DRG: 775 | Disposition: A | Discharge status: Home / Self Care | Payer: 59 | Source: Ambulatory Visit | Attending: Obstetrics and Gynecology | Admitting: Obstetrics and Gynecology

## 2012-10-23 DIAGNOSIS — O09529 Supervision of elderly multigravida, unspecified trimester: Secondary | ICD-10-CM | POA: Diagnosis present

## 2012-10-23 DIAGNOSIS — O99892 Other specified diseases and conditions complicating childbirth: Secondary | ICD-10-CM | POA: Diagnosis present

## 2012-10-23 DIAGNOSIS — O409XX0 Polyhydramnios, unspecified trimester, not applicable or unspecified: Principal | ICD-10-CM | POA: Diagnosis present

## 2012-10-23 DIAGNOSIS — Z22330 Carrier of Group B streptococcus: Secondary | ICD-10-CM

## 2012-10-23 DIAGNOSIS — O34219 Maternal care for unspecified type scar from previous cesarean delivery: Secondary | ICD-10-CM

## 2012-10-23 LAB — CBC
HCT: 38.9 % (ref 36.0–46.0)
Hemoglobin: 13.6 g/dL (ref 12.0–15.0)
MCH: 31 pg (ref 26.0–34.0)
MCV: 88.6 fL (ref 78.0–100.0)
RBC: 4.39 MIL/uL (ref 3.87–5.11)

## 2012-10-23 LAB — COMPREHENSIVE METABOLIC PANEL
Alkaline Phosphatase: 144 U/L — ABNORMAL HIGH (ref 39–117)
BUN: 14 mg/dL (ref 6–23)
CO2: 24 mEq/L (ref 19–32)
Chloride: 100 mEq/L (ref 96–112)
Creatinine, Ser: 0.64 mg/dL (ref 0.50–1.10)
GFR calc non Af Amer: >90 mL/min (ref >90)
Glucose, Bld: 83 mg/dL (ref 70–99)
Total Bilirubin: 0.3 mg/dL (ref 0.3–1.2)

## 2012-10-23 LAB — URIC ACID: Uric Acid, Serum: 4.5 mg/dL (ref 2.4–7.0)

## 2012-10-23 MED ORDER — OXYTOCIN 10 UNIT/ML IJ SOLN: 10.0000 [IU] | Freq: Once | INTRAMUSCULAR | Status: DC

## 2012-10-23 MED ORDER — LACTATED RINGERS IV SOLN
INTRAVENOUS | Status: DC
  Administered 2012-10-23: 19:00:00 via INTRAVENOUS

## 2012-10-23 MED ORDER — OXYTOCIN 40 UNITS IN LACTATED RINGERS INFUSION - SIMPLE MED: 62.5000 mL/h | INTRAVENOUS | Status: DC

## 2012-10-23 MED ORDER — IBUPROFEN 600 MG PO TABS
600.0000 mg | ORAL_TABLET | Freq: Four times a day (QID) | ORAL | Status: DC | PRN
  Administered 2012-10-24: 600 mg via ORAL
  Filled 2012-10-23: qty 1

## 2012-10-23 MED ORDER — PENICILLIN G POTASSIUM 5000000 UNITS IJ SOLR: 5.0000 10*6.[IU] | Freq: Once | INTRAVENOUS | Status: DC

## 2012-10-23 MED ORDER — OXYTOCIN BOLUS FROM INFUSION: 500.0000 mL | INTRAVENOUS | Status: DC

## 2012-10-23 MED ORDER — PENICILLIN G POTASSIUM 5000000 UNITS IJ SOLR: 2.5000 10*6.[IU] | INTRAVENOUS | Status: DC

## 2012-10-23 MED ORDER — LIDOCAINE HCL (PF) 1 % IJ SOLN
30.0000 mL | INTRAMUSCULAR | Status: DC | PRN
  Filled 2012-10-23 (×2): qty 30

## 2012-10-23 MED ORDER — OXYTOCIN 40 UNITS IN LACTATED RINGERS INFUSION - SIMPLE MED
1.0000 m[IU]/min | INTRAVENOUS | Status: DC
  Administered 2012-10-23: 2 m[IU]/min via INTRAVENOUS
  Filled 2012-10-23: qty 1000

## 2012-10-23 MED ORDER — FAMOTIDINE IN NACL 20-0.9 MG/50ML-% IV SOLN
20.0000 mg | Freq: Two times a day (BID) | INTRAVENOUS | Status: DC
  Administered 2012-10-23: 20 mg via INTRAVENOUS
  Filled 2012-10-23 (×2): qty 50

## 2012-10-23 MED ORDER — CITRIC ACID-SODIUM CITRATE 334-500 MG/5ML PO SOLN: 30.0000 mL | ORAL | Status: DC | PRN

## 2012-10-23 MED ORDER — NALBUPHINE SYRINGE 5 MG/0.5 ML
10.0000 mg | INJECTION | INTRAMUSCULAR | Status: DC | PRN
  Administered 2012-10-23: 10 mg via INTRAVENOUS
  Filled 2012-10-23 (×2): qty 1

## 2012-10-23 MED ORDER — TERBUTALINE SULFATE 1 MG/ML IJ SOLN: 0.2500 mg | Freq: Once | INTRAMUSCULAR | Status: AC | PRN

## 2012-10-23 MED ORDER — ONDANSETRON HCL 4 MG/2ML IJ SOLN: 4.0000 mg | Freq: Four times a day (QID) | INTRAMUSCULAR | Status: DC | PRN

## 2012-10-23 MED ORDER — LACTATED RINGERS IV SOLN: 500.0000 mL | INTRAVENOUS | Status: DC | PRN

## 2012-10-23 MED ORDER — OXYCODONE-ACETAMINOPHEN 5-325 MG PO TABS: 1.0000 | ORAL_TABLET | ORAL | Status: DC | PRN

## 2012-10-23 MED ORDER — ACETAMINOPHEN 325 MG PO TABS: 650.0000 mg | ORAL_TABLET | ORAL | Status: DC | PRN

## 2012-10-23 NOTE — Progress Notes (Signed)
[name] is [name] 37 y.o. [id number] at [redacted]w[redacted]d by LMP admitted for induction of labor due to Hydramnios.  Subjective: No chief complaint on file.  Notes painful ctx Declines pain med Notes reflux/heartburn  Pitocin  Objective: BP 119/66  Pulse 73  Temp(Src) 98 F (36.7 C) (Oral)  Resp 18  Ht 5\' 6"  (1.676 m)  Wt 78.019 kg (172 lb)  BMI 27.77 kg/m2      FHT:  FHR: 130 bpm, variability: marked,  accelerations:  Present,  decelerations:  Absent UC:   irregular, every 2-4 minutes SVE:   4 cm dilated, 80% effaced, -3 station, left asynclytic Tracing: cat 1 240 MV units/10 ctx  Labs: Lab Results  Component Value Date   WBC 13.2* 10/23/2012   HGB 13.6 10/23/2012   HCT 38.9 10/23/2012   MCV 88.6 10/23/2012   PLT 209 10/23/2012    Assessment / Plan: Arrest in active phase of labor Polyhydramnios Prev LTCS  P) cont with pitocin. Exaggerated right sims position. Analgesic offered and avail. Pepcid  Anticipated MOD:  NSVD  [name] 10/23/2012, 11:04 PM

## 2012-10-23 NOTE — H&P (Signed)
[name] is a 37 y.o. female presenting for  Induction @ 39 6/7 weeks due to polyhydramnios noted on sonogram done today for S>D.  AFI 34.5 cm, EFW 9lb 8 oz PNC otherwise uncomplicated. Hx LTCS w/ successful interim VBAC.  (+) FM  Maternal Medical History:  Contractions: Frequency: rare.    Fetal activity: Perceived fetal activity is normal.    Prenatal complications: Polyhydramnios.   Prenatal Complications - Diabetes: none.    OB History   Grav Para Term Preterm Abortions TAB SAB Ect Mult Living   5 2 2  3 1 2   2      Past Medical History  Diagnosis Date  . Abnormal Pap smear   . Anemia   . Routine postpartum follow-up 12/16/2010   Past Surgical History  Procedure Laterality Date  . Cesarean section    . Wisdom tooth extraction    . Dilation and curettage of uterus    . Leep    . Dilation and curettage of uterus  2008   Family History: family history is not on file. Social History:  reports that she has never smoked. She does not have any smokeless tobacco history on file. She reports that she does not drink alcohol or use illicit drugs.   Prenatal Transfer Tool  Maternal Diabetes: No Genetic Screening: Normal Maternal Ultrasounds/Referrals: Normal Fetal Ultrasounds or other Referrals:  None Maternal Substance Abuse:  No Significant Maternal Medications:  None Significant Maternal Lab Results:  Lab values include: Group B Strep positive Other Comments:  polyhydramnios  ROS neg    not currently breastfeeding. Maternal Exam:  Uterine Assessment: Contraction frequency is rare.   Abdomen: Patient reports no abdominal tenderness. Surgical scars: low transverse.   Estimated fetal weight is 9 lb 8 oz.   Fetal presentation: vertex  Introitus: Normal vulva. Ferning test: not done.  Nitrazine test: not done.  Pelvis: adequate for delivery.   Cervix: Cervix evaluated by digital exam.     Physical Exam  Constitutional: She is oriented to person, place, and  time. She appears well-developed and well-nourished.  Eyes: EOM are normal.  Neck: Normal range of motion.  Cardiovascular: Regular rhythm.   Respiratory: Breath sounds normal.  GI: Soft.  Gravid. (+) Pfannensteil incision  Neurological: She is alert and oriented to person, place, and time.  Skin: Skin is warm and dry.  Psychiatric: She has a normal mood and affect.   VE 4/70/-3  Prenatal labs: ABO, Rh:  O positive Antibody:  neg Rubella:  Immune RPR:   NR HBsAg:   neg HIV:   NR GBS:   neg  tracing; baseline 130 (+) accels, (+) ctx  Assessment/Plan: Polyhydramnios Prev LTCS w/ successful VBAC Term gestation P) admit routine labs. Controlled amniotomy if ctxs present, otherwise pitocin induction  Clairissa Valvano A 10/23/2012, 6:06 PM

## 2012-10-23 NOTE — Progress Notes (Signed)
S: notes some ctx  O: tracing: cat I irreg ctx VE unchanged Controlled amniotomy done: copious clear fluid noted IUPC placed   IMP: Polyhydramnios Term gestation. Prev LTCS w/ successful VBAC  P)if no change in ctx pattern in 1/2 hour, then pitocin augmentation

## 2012-10-24 ENCOUNTER — Encounter (HOSPITAL_COMMUNITY): Payer: Self-pay | Admitting: *Deleted

## 2012-10-24 MED ORDER — IBUPROFEN 600 MG PO TABS
600.0000 mg | ORAL_TABLET | Freq: Four times a day (QID) | ORAL | Status: DC
  Administered 2012-10-24 – 2012-10-25 (×4): 600 mg via ORAL
  Filled 2012-10-24 (×4): qty 1

## 2012-10-24 MED ORDER — WITCH HAZEL-GLYCERIN EX PADS
1.0000 "application " | MEDICATED_PAD | CUTANEOUS | Status: DC | PRN
  Administered 2012-10-24: 1 via TOPICAL

## 2012-10-24 MED ORDER — ZOLPIDEM TARTRATE 5 MG PO TABS: 5.0000 mg | ORAL_TABLET | Freq: Every evening | ORAL | Status: DC | PRN

## 2012-10-24 MED ORDER — DIBUCAINE 1 % RE OINT
1.0000 "application " | TOPICAL_OINTMENT | RECTAL | Status: DC | PRN
  Administered 2012-10-24: 1 via RECTAL
  Filled 2012-10-24: qty 28

## 2012-10-24 MED ORDER — PRENATAL MULTIVITAMIN CH
1.0000 | ORAL_TABLET | Freq: Every day | ORAL | Status: DC
  Administered 2012-10-24: 1 via ORAL
  Filled 2012-10-24: qty 1

## 2012-10-24 MED ORDER — TETANUS-DIPHTH-ACELL PERTUSSIS 5-2.5-18.5 LF-MCG/0.5 IM SUSP: 0.5000 mL | Freq: Once | INTRAMUSCULAR | Status: DC

## 2012-10-24 MED ORDER — LANOLIN HYDROUS EX OINT: TOPICAL_OINTMENT | CUTANEOUS | Status: DC | PRN

## 2012-10-24 MED ORDER — OXYCODONE-ACETAMINOPHEN 5-325 MG PO TABS: 1.0000 | ORAL_TABLET | ORAL | Status: DC | PRN

## 2012-10-24 MED ORDER — ONDANSETRON HCL 4 MG PO TABS: 4.0000 mg | ORAL_TABLET | ORAL | Status: DC | PRN

## 2012-10-24 MED ORDER — ONDANSETRON HCL 4 MG/2ML IJ SOLN: 4.0000 mg | INTRAMUSCULAR | Status: DC | PRN

## 2012-10-24 MED ORDER — DIPHENHYDRAMINE HCL 25 MG PO CAPS: 25.0000 mg | ORAL_CAPSULE | Freq: Four times a day (QID) | ORAL | Status: DC | PRN

## 2012-10-24 MED ORDER — SIMETHICONE 80 MG PO CHEW: 80.0000 mg | CHEWABLE_TABLET | ORAL | Status: DC | PRN

## 2012-10-24 MED ORDER — SENNOSIDES-DOCUSATE SODIUM 8.6-50 MG PO TABS
2.0000 | ORAL_TABLET | Freq: Every day | ORAL | Status: DC
  Administered 2012-10-24: 2 via ORAL

## 2012-10-24 MED ORDER — BENZOCAINE-MENTHOL 20-0.5 % EX AERO
1.0000 "application " | INHALATION_SPRAY | CUTANEOUS | Status: DC | PRN
  Administered 2012-10-24: 1 via TOPICAL
  Filled 2012-10-24: qty 56

## 2012-10-24 NOTE — Progress Notes (Signed)
Patient ID: [name], female   DOB: Aug 06, 1975, 37 y.o.   MRN: [id number] PPD # 0 Interval note  Subjective: Pt reports feeling well/OOB and ambulating/ Pain controlled with ibuprofen Tolerating po/ Voiding without problems/ No n/v Bleeding is moderate Newborn info:  Information for the patient's newborn:  Kassidi, Elza [[id number]]  female  / circ pending/ Feeding: breast   Objective:  VS: Blood pressure 110/70, pulse 74, temperature 98 F (36.7 C), temperature source Oral, resp. rate 20.    Recent Labs  10/23/12 1830  WBC 13.2*  HGB 13.6  HCT 38.9  PLT 209    Blood type: O POS Rubella:   Immune   Physical Exam:  General: alert, cooperative and no distress Abdomen: soft, nontender, normal bowel sounds Uterine Fundus: firm, below umbilicus, nontender Ext: edema trace and Homans sign is negative, no sign of DVT   A/P: PPD # 0/ G6P3033/ S/P: Repeat VBAC, no laceration Doing well Continue routine post partum orders Anticipate D/C home tomorrow    Demetrius Revel, MSN, [facility] 10/24/2012, 10:57 AM

## 2012-10-24 NOTE — Lactation Note (Signed)
This note was copied from the chart of Cynthia Trula Frede. Lactation Consultation Note: Initial visit with mom. Experienced BF mom for 4 and 9 months with previous children. Mom reports that baby nursed well after delivery and has nursed several times since. Is sleepy at present. Reports that baby is latching well with no pain. BF brochure given with resources for support after DC. No questions at present. To call prn  Patient Name: [name] Today's Date: 10/24/2012 Reason for consult: Initial assessment   Maternal Data Formula Feeding for Exclusion: No Infant to breast within first hour of birth: Yes Does the patient have breastfeeding experience prior to this delivery?: Yes  Feeding   LATCH Score/Interventions                      Lactation Tools Discussed/Used     Consult Status Consult Status: PRN    Pamelia Hoit 10/24/2012, 2:21 PM

## 2012-10-25 ENCOUNTER — Encounter (HOSPITAL_COMMUNITY): Payer: Self-pay | Admitting: Obstetrics and Gynecology

## 2012-10-25 DIAGNOSIS — O34219 Maternal care for unspecified type scar from previous cesarean delivery: Secondary | ICD-10-CM

## 2012-10-25 LAB — CBC
HCT: 38.5 % (ref 36.0–46.0)
Hemoglobin: 12.8 g/dL (ref 12.0–15.0)
MCV: 90.8 fL (ref 78.0–100.0)
Platelets: 181 10*3/uL (ref 150–400)
RBC: 4.24 MIL/uL (ref 3.87–5.11)
WBC: 10.7 10*3/uL — ABNORMAL HIGH (ref 4.0–10.5)

## 2012-10-25 MED ORDER — IBUPROFEN 600 MG PO TABS: 600.0000 mg | ORAL_TABLET | Freq: Four times a day (QID) | ORAL | Status: AC | PRN

## 2012-10-25 NOTE — Progress Notes (Signed)
Patient ID: [name], female   DOB: [birth year], 37 y.o.   MRN: [id number] PPD # 1  Subjective: Pt reports feeling well and eager for early d/c home/ Pain controlled with ibuprofen Tolerating po/ Voiding without problems/ No n/v Bleeding is light/ Newborn info:  Information for the patient's newborn:  Cynthia, [name] [[id number]]  female  / circ this am/ Feeding: breast    Objective:  VS: Blood pressure 104/64, pulse 71, temperature 97.9 F (36.6 C), temperature source Oral, resp. rate 18.    Recent Labs  10/23/12 1830 10/25/12 0605  WBC 13.2* 10.7*  HGB 13.6 12.8  HCT 38.9 38.5  PLT 209 181    Blood type: O POS  Rubella:   Immune   Physical Exam:  General: A & O x 3  alert, cooperative and no distress CV: Regular rate and rhythm Resp: clear Abdomen: soft, nontender, normal bowel sounds Uterine Fundus: firm, below umbilicus, nontender Perineum: intact Lochia: minimal Ext: edema trace and Homans sign is negative, no sign of DVT    A/P: PPD # 1/ G6P3033/ S/P: VBAC Doing well and stable for discharge home RX: Ibuprofen 600mg  po Q 6 hrs prn pain #30 Refill x 1 WOB/GYN booklet given Routine pp visit in 6wks   Demetrius Revel, MSN, [facility] 10/25/2012, 9:58 AM

## 2012-10-25 NOTE — Discharge Summary (Signed)
Obstetric Discharge Summary Reason for Admission: G6 P2 0 3 2 @ 39w 6d for IOL d/t polyhydramnios and S>D, desired VBAC; hx successful VBAC with previous pregnancy. Prenatal Procedures: NST and ultrasound Intrapartum Procedures: spontaneous vaginal delivery Postpartum Procedures: none Complications-Operative and Postpartum: none Hemoglobin  Date Value Range Status  10/25/2012 12.8  12.0 - 15.0 g/dL Final     HCT  Date Value Range Status  10/25/2012 38.5  36.0 - 46.0 % Final    Physical Exam:  General: alert, cooperative and no distress Lochia: appropriate Uterine Fundus: firm Incision: na DVT Evaluation: No evidence of DVT seen on physical exam.  Discharge Diagnoses: G6 P3 0 3 3 s/p successful VBAC  Discharge Information: Date: 10/25/2012 Activity: pelvic rest Diet: routine Medications: PNV, Ibuprofen and Colace Condition: stable Instructions: refer to practice specific booklet Discharge to: home Follow-up Information   Follow up with Lonald Troiani A, MD In 6 weeks.   Contact information:   [address] [place] [address] [phone]       Newborn Data: Live born female 10/24/12 circ done Birth Weight: 8 lb 10.3 oz (3920 g) APGAR: 8, 9  Home with mother.  FISHER,JULIE K 10/25/2012, 10:03 AM

## 2013-03-04 ENCOUNTER — Other Ambulatory Visit: Payer: Self-pay

## 2013-04-30 ENCOUNTER — Ambulatory Visit (INDEPENDENT_AMBULATORY_CARE_PROVIDER_SITE_OTHER): Payer: 59 | Admitting: Emergency Medicine

## 2013-04-30 VITALS — BP 118/76 | HR 81 | Temp 98.0°F | Resp 16 | Ht 64.5 in | Wt 146.0 lb

## 2013-04-30 DIAGNOSIS — Z0000 Encounter for general adult medical examination without abnormal findings: Secondary | ICD-10-CM

## 2013-04-30 LAB — LIPID PANEL
CHOL/HDL RATIO: 2.4 ratio
CHOLESTEROL: 181 mg/dL (ref 0–200)
HDL: 74 mg/dL (ref >39)
LDL Cholesterol: 97 mg/dL (ref 0–99)
Triglycerides: 49 mg/dL (ref <150)
VLDL: 10 mg/dL (ref 0–40)

## 2013-04-30 LAB — POCT CBC
Granulocyte percent: 56.7 %G (ref 37–80)
HEMATOCRIT: 44.1 % (ref 37.7–47.9)
HEMOGLOBIN: 13.9 g/dL (ref 12.2–16.2)
LYMPH, POC: 2.3 (ref 0.6–3.4)
MCH: 30.2 pg (ref 27–31.2)
MCHC: 31.5 g/dL — AB (ref 31.8–35.4)
MCV: 95.6 fL (ref 80–97)
MID (cbc): 0.5 (ref 0–0.9)
MPV: 7.9 fL (ref 0–99.8)
POC GRANULOCYTE: 3.6 (ref 2–6.9)
POC LYMPH PERCENT: 35.6 %L (ref 10–50)
POC MID %: 7.7 %M (ref 0–12)
Platelet Count, POC: 314 10*3/uL (ref 142–424)
RBC: 4.61 M/uL (ref 4.04–5.48)
RDW, POC: 13.5 %
WBC: 6.4 10*3/uL (ref 4.6–10.2)

## 2013-04-30 LAB — POCT URINALYSIS DIPSTICK
BILIRUBIN UA: NEGATIVE
Blood, UA: NEGATIVE
GLUCOSE UA: NEGATIVE
KETONES UA: NEGATIVE
NITRITE UA: NEGATIVE
PH UA: 7
Protein, UA: NEGATIVE
Spec Grav, UA: 1.015
Urobilinogen, UA: 0.2

## 2013-04-30 LAB — TSH: TSH: 1.712 u[IU]/mL (ref 0.350–4.500)

## 2013-04-30 LAB — COMPREHENSIVE METABOLIC PANEL
ALBUMIN: 4.9 g/dL (ref 3.5–5.2)
ALT: 17 U/L (ref 0–35)
AST: 20 U/L (ref 0–37)
Alkaline Phosphatase: 75 U/L (ref 39–117)
BUN: 15 mg/dL (ref 6–23)
CALCIUM: 9.4 mg/dL (ref 8.4–10.5)
CHLORIDE: 99 meq/L (ref 96–112)
CO2: 28 meq/L (ref 19–32)
CREATININE: 0.72 mg/dL (ref 0.50–1.10)
Glucose, Bld: 82 mg/dL (ref 70–99)
POTASSIUM: 3.8 meq/L (ref 3.5–5.3)
Sodium: 141 mEq/L (ref 135–145)
TOTAL PROTEIN: 7.4 g/dL (ref 6.0–8.3)
Total Bilirubin: 0.4 mg/dL (ref 0.3–1.2)

## 2013-04-30 NOTE — Progress Notes (Signed)
   Subjective:    Patient ID: [name], female    DOB: May 20, 1975, 38 y.o.   MRN: [id number]  HPI    Review of Systems     Objective:   Physical Exam        Assessment & Plan:

## 2013-04-30 NOTE — Progress Notes (Signed)
   Subjective:    Patient ID: [name], female    DOB: 10-05-1975, 38 y.o.   MRN: [id number]  HPI Patient is here for a complete annual physical with no pap she has her own OBGYN  For her paps Last recent paps has been normal She has 23 55 year old children they were all normal Patient is in good health she takes multi vits daily   Review of Systems     Objective:   Physical Exam        Assessment & Plan:

## 2013-04-30 NOTE — Progress Notes (Signed)
@UMFCLOGO @  Patient ID: [name] MRN: [id number], DOB: 10/21/1975, 38 y.o. Date of Encounter: 04/30/2013, 10:12 AM  Primary Physician: No primary provider on file.  Chief Complaint: Physical (CPE)  HPI: 38 y.o. y/o female with history of noted below here for CPE.  Doing well. No issues/complaints.  LMP:  Pap: MMG: Review of Systems:  Consitutional: No fever, chills, fatigue, night sweats, lymphadenopathy, or weight changes. Eyes: No visual changes, eye redness, or discharge. ENT/Mouth: Ears: No otalgia, tinnitus, hearing loss, discharge. Nose: No congestion, rhinorrhea, sinus pain, or epistaxis. Throat: No sore throat, post nasal drip, or teeth pain. Cardiovascular: No CP, palpitations, diaphoresis, DOE, edema, orthopnea, PND. Respiratory: Gastrointestinal: No anorexia, dysphagia, reflux, pain, nausea, vomiting, hematemesis, diarrhea, constipation, BRBPR, or melena. She had GI symptoms recently and went to see Dr. Benson Norway checkup there was good. Breast: No discharge, pain, swelling, or mass he is currently breast-feeding. Genitourinary: No dysuria, frequency, urgency, hematuria, incontinence, nocturia, amenorrhea, vaginal discharge, pruritis, burning, abnormal bleeding, or pain. She has a GYN doctor she sees regularly. She just had a baby 6 months ago with an uncomplicated delivery Musculoskeletal: No decreased ROM, myalgias, stiffness, joint swelling, or weakness. Skin: No rash, erythema, lesion changes, pain, warmth, jaundice, or pruritis. Neurological: No headache, dizziness, syncope, seizures, tremors, memory loss, coordination problems, or paresthesias. Psychological: No anxiety, depression, hallucinations, SI/HI. Endocrine: No fatigue, polydipsia, polyphagia, polyuria, or known diabetes. All other systems were reviewed and are otherwise negative.  Past Medical History  Diagnosis Date  . Abnormal Pap smear   . Anemia   . Routine postpartum follow-up 12/16/2010  . Postpartum  care following vaginal delivery (10/24/12) 10/25/2012  . VBAC, delivered 10/25/2012     Past Surgical History  Procedure Laterality Date  . Cesarean section    . Wisdom tooth extraction    . Dilation and curettage of uterus    . Leep    . Dilation and curettage of uterus  2008    Home Meds:  Prior to Admission medications   Medication Sig Start Date End Date Taking? Authorizing Provider  calcium carbonate (TUMS - DOSED IN MG ELEMENTAL CALCIUM) 500 MG chewable tablet Chew 2 tablets by mouth 3 (three) times daily as needed for heartburn.   Yes Historical Provider, MD  hydrocortisone cream 1 % Apply 1 application topically daily as needed (For itching.).   Yes Historical Provider, MD  ibuprofen (ADVIL,MOTRIN) 600 MG tablet Take 1 tablet (600 mg total) by mouth every 6 (six) hours as needed for pain. 10/25/12  Yes Darleen Crocker, NP  Docosahexaenoic Acid (DHA PO) Take 1 capsule by mouth at bedtime.    Historical Provider, MD  Prenatal Vit-Fe Fumarate-FA (PRENATAL MULTIVITAMIN) TABS Take 1 tablet by mouth at bedtime.    Historical Provider, MD    Allergies: No Known Allergies  History   Social History  . Marital Status: Married    Spouse Name: N/A    Number of Children: N/A  . Years of Education: N/A   Occupational History  . Not on file.   Social History Main Topics  . Smoking status: Never Smoker   . Smokeless tobacco: Never Used  . Alcohol Use: No  . Drug Use: No  . Sexual Activity: Yes    Birth Control/ Protection: None   Other Topics Concern  . Not on file   Social History Narrative  . No narrative on file    Family History  Problem Relation Age of Onset  . Heart disease Mother   .  Hypertension Father   . Hyperlipidemia Father     Physical Exam  Blood pressure 118/76, pulse 81, temperature 98 F (36.7 C), temperature source Oral, resp. rate 16, height 5' 4.5" (1.638 m), weight 146 lb (66.225 kg), last menstrual period 01/15/2012, SpO2 98.00%, currently  breastfeeding., Body mass index is 24.68 kg/(m^2). General: Well developed, well nourished, in no acute distress. HEENT: Normocephalic, atraumatic. Conjunctiva pink, sclera non-icteric. Pupils 2 mm constricting to 1 mm, round, regular, and equally reactive to light and accomodation. EOMI. Internal auditory canal clear. TMs with good cone of light and without pathology. Nasal mucosa pink. Nares are without discharge. No sinus tenderness. Oral mucosa pink. Dentition . Pharynx without exudate.   Neck: Supple. Trachea midline. No thyromegaly. Full ROM. No lymphadenopathy. Lungs: Clear to auscultation bilaterally without wheezes, rales, or rhonchi. Breathing is of normal effort and unlabored. Cardiovascular: RRR with S1 S2. No murmurs, rubs, or gallops appreciated. Distal pulses 2+ symmetrically. No carotid or abdominal bruits Breast: Deferred   Abdomen: Soft, non-tender, non-distended with normoactive bowel sounds. No hepatosplenomegaly or masses. No rebound/guarding. No CVA tenderness. Without hernias.  Genitourinary:  Deferred Musculoskeletal: Full range of motion and 5/5 strength throughout. Without swelling, atrophy, tenderness, crepitus, or warmth. Extremities without clubbing, cyanosis, or edema. Calves supple. Skin: Warm and moist without erythema, ecchymosis, wounds, or rash. Neuro: A+Ox3. CN II-XII grossly intact. Moves all extremities spontaneously. Full sensation throughout. Normal gait. DTR 2+ throughout upper and lower extremities. Finger to nose intact. Psych:  Responds to questions appropriately with a normal affect.   Results for orders placed in visit on 04/30/13  POCT CBC      Result Value Range   WBC 6.4  4.6 - 10.2 K/uL   Lymph, poc 2.3  0.6 - 3.4   POC LYMPH PERCENT 35.6  10 - 50 %L   MID (cbc) 0.5  0 - 0.9   POC MID % 7.7  0 - 12 %M   POC Granulocyte 3.6  2 - 6.9   Granulocyte percent 56.7  37 - 80 %G   RBC 4.61  4.04 - 5.48 M/uL   Hemoglobin 13.9  12.2 - 16.2 g/dL    HCT, POC 44.1  37.7 - 47.9 %   MCV 95.6  80 - 97 fL   MCH, POC 30.2  27 - 31.2 pg   MCHC 31.5 (*) 31.8 - 35.4 g/dL   RDW, POC 13.5     Platelet Count, POC 314  142 - 424 K/uL   MPV 7.9  0 - 99.8 fL  POCT URINALYSIS DIPSTICK      Result Value Range   Color, UA yellow     Clarity, UA clear     Glucose, UA neg     Bilirubin, UA neg     Ketones, UA neg     Spec Grav, UA 1.015     Blood, UA neg     pH, UA 7.0     Protein, UA neg     Urobilinogen, UA 0.2     Nitrite, UA neg     Leukocytes, UA small (1+)         Assessment/Plan:  38 y.o. y/o female here for CPE. She will continue her regular checkups with her gynecologist. She is scheduled for colonoscopy at age 24. She has skin checks every year and a half to 2 years. Routine labs were ordered . She is up-to-date on everything immunization while she had she got about 1-1/2 years ago.  flu shot up-to-date this year  -  Signed, Nena Jordan, MD 04/30/2013 10:12 AM

## 2013-05-02 ENCOUNTER — Telehealth: Payer: Self-pay

## 2013-05-02 NOTE — Telephone Encounter (Signed)
Patient returned call about labs.  Would like a call back.

## 2013-05-03 NOTE — Telephone Encounter (Signed)
Notes Recorded by Belva Chimes, LPN on 05/04/5788 at 38:33 AM Lm for rtn call ------  Notes Recorded by Darlyne Russian, MD on 04/30/2013 at 9:03 PM Please notify patient labs are normal  Called to advise, labs normal left another message for call back.

## 2013-12-03 ENCOUNTER — Telehealth: Payer: Self-pay | Admitting: *Deleted

## 2013-12-03 NOTE — Telephone Encounter (Signed)
Pt left me a message to call her about scheduling a genetic appt.  Called and left a message for the pt to return my call so I can schedule her.

## 2014-01-13 ENCOUNTER — Telehealth: Payer: Self-pay | Admitting: *Deleted

## 2014-01-13 NOTE — Telephone Encounter (Signed)
Pt called requesting a genetic appt.  Her mom Karle Plumber and Valentina Shaggy O'Boyle both came and had the genetic testing competed.  Confirmed 01/18/14 genetic appt w/ pt.

## 2014-01-18 ENCOUNTER — Encounter: Payer: Self-pay | Admitting: Genetic Counselor

## 2014-01-18 ENCOUNTER — Ambulatory Visit (HOSPITAL_BASED_OUTPATIENT_CLINIC_OR_DEPARTMENT_OTHER): Payer: 59 | Admitting: Genetic Counselor

## 2014-01-18 ENCOUNTER — Other Ambulatory Visit: Payer: 59

## 2014-01-18 DIAGNOSIS — Z801 Family history of malignant neoplasm of trachea, bronchus and lung: Secondary | ICD-10-CM

## 2014-01-18 DIAGNOSIS — IMO0002 Reserved for concepts with insufficient information to code with codable children: Secondary | ICD-10-CM

## 2014-01-18 DIAGNOSIS — Z807 Family history of other malignant neoplasms of lymphoid, hematopoietic and related tissues: Secondary | ICD-10-CM

## 2014-01-18 DIAGNOSIS — Z800 Family history of malignant neoplasm of digestive organs: Secondary | ICD-10-CM

## 2014-01-18 DIAGNOSIS — Z8041 Family history of malignant neoplasm of ovary: Secondary | ICD-10-CM

## 2014-01-18 DIAGNOSIS — Z808 Family history of malignant neoplasm of other organs or systems: Secondary | ICD-10-CM

## 2014-01-18 DIAGNOSIS — Z803 Family history of malignant neoplasm of breast: Secondary | ICD-10-CM | POA: Insufficient documentation

## 2014-01-18 NOTE — Progress Notes (Signed)
Patient Name: [name] Patient Age: 38 y.o. Encounter Date: 01/18/2014  Referring Physician: SELF  Primary Care Provider: Servando Salina, MD   [name], a 38 y.o. 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  She presents to clinic today to discuss genetic testing for this mutation.  HISTORY OF PRESENT ILLNESS: [name] has no personal history of cancer and is in generally good health.  Past Medical History  Diagnosis Date  . Abnormal Pap smear   . Anemia   . Routine postpartum follow-up 12/16/2010  . Postpartum care following vaginal delivery (10/24/12) 10/25/2012  . VBAC, delivered 10/25/2012  . Family history of malignant neoplasm of breast   . Family history of malignant neoplasm of ovary     Past Surgical History  Procedure Laterality Date  . Cesarean section    . Wisdom tooth extraction    . Dilation and curettage of uterus    . Leep    . Dilation and curettage of uterus  2008    History   Social History  . Marital Status: Married    Spouse Name: N/A    Number of Children: N/A  . Years of Education: N/A   Social History Main Topics  . Smoking status: Never Smoker   . Smokeless tobacco: Never Used  . Alcohol Use: No  . Drug Use: No  . Sexual Activity: Yes    Birth Control/ Protection: None   Other Topics Concern  . Not on file   Social History Narrative  . No narrative on file     FAMILY HISTORY:   During the visit, a 4-generation pedigree was obtained. Family tree will be sent for scanning and will be in EPIC under the Media tab.  Significant diagnoses include the following:  Family History  Problem Relation Age of Onset  . Heart disease Mother   . Rectal cancer Mother 77  . Skin cancer Mother   . Other Mother     BRIP1 mutation (p.R798*)  . Hypertension Father   . Hyperlipidemia Father   . Skin cancer Father 37    deceased  23  . Breast cancer Maternal Aunt 52  . Ovarian cancer Maternal Aunt 48  . Lymphoma Maternal Uncle     deceased 67; Agent Orange exp.  . Lung cancer Maternal Uncle     deceased 56; smoker    Her maternal aunt who had breast and ovarian cancers was found to have a pathogenic BRIP1 mutation (p.R798* ; c.2392C>T) as well as a BRCA2 variant of unknown significance (VUS). Her mother has the BRIP1 mutation.  [name] ancestry is Caucasian. There is no known Jewish ancestry and no consanguinity.  ASSESSMENT AND PLAN: [name] is a 38 y.o. female with a family history of breast and ovarian cancer in her maternal aunt who was found to have a pathogenic BRIP1 mutation. This mutation was recently identified in others in her family, including her mother. We reviewed the characteristics, features and inheritance patterns of hereditary cancer syndromes and the limited information that is known about BRIP1. Pathogenic mutations in BRIP1 are associated with an increased risk of breast and ovarian cancer, but specific lifetime risks are not known. Given this limited information, there are no specific recommendations from the NCCN or any other group regarding management of those with a BRIP1 mutation. BRIP1 is not thought to be associated with rectal cancer  which her mother had. We also discussed genetic testing, including the other appropriate family members to test, the process of testing, and insurance coverage. We emphasized that there is no clinical utility of testing her for the BRCA2 VUS found in her aunt. In fact, her mother also did not get testing for it for the same reason.   [name] wished to pursue genetic testing and a blood sample will be sent to [facility] for analysis of the specific BRIP1 mutation that was found in her family. We discussed the implications of a positive and negative result. Results should be available in approximately 4 weeks, at which point we will contact her and address  implications for her as well as address genetic testing for at-risk family members, if needed.    We encouraged [name] to remain in contact with Cancer Genetics annually so that we can update the family history and inform her of any changes in cancer genetics and testing that may be of benefit for this family. Ms.  Ressel questions were answered to her satisfaction today.   Thank you for the referral and allowing [place] to share in the care of your patient.   The patient was seen for a total of 20 minutes, greater than 50% of which was spent face-to-face counseling. This patient was discussed with the overseeing provider who agrees with the above.   Steele Berg, MS, [place] Certified Genetic Counseor phone: [phone] [email or web address]

## 2014-02-04 ENCOUNTER — Encounter: Payer: Self-pay | Admitting: Genetic Counselor

## 2014-02-04 NOTE — Progress Notes (Signed)
GENETIC TEST RESULTS  Referring Provider: Servando Salina, MD    [name] was seen in the [facility] on 01/18/14 due to a family of cancer and to have testing for a familial BRIP1 pathogenic mutation. Please refer to the prior Genetics clinic note for more information regarding [name]'s medical and family histories and our assessment at the time.   GENETIC TESTING: At the time of [name]'s visit, we recommended she pursue genetic testing for the specific BRIP1 mutation that was identified in the family. 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   This gene was only recently found to be associated with an increased risk of breast and ovarian cancer, thus we do not know as much about it as we do genes like BRCA1 and BRCA2. The role of the BRIP1 gene is just starting to be understood, but it is known to play a role in the pathway that is critical for DNA repair. While data is still relatively limited, studies suggest mutations in BRIP1 increase the risk of breast cancer by about 2-3 fold, and increase the risk of developing ovarian caner. The specific lifetime risks are not well-defined at this time.   Having two pathogenic mutations in the BRIP1 gene causes Fanconi anemia type J (FA-J), a rare autosomal recessive disorder affecting multiple body systems.  MEDICAL MANAGEMENT: There are currently no NCCN BRIP1-specific guidelines, and so we rely on the personal and family history to provide guidance in deciding regarding management. [name] has not had breast or ovarian cancer, but she did have a prophylactic TAH/BSO in her 93s. [name] maternal aunt had both ovarian cancer at age 35 and breast cancer at age 60.  We discussed that some providers will screen women for breast cancer with a yearly mammogram and breast MRI, even though NCCN does not have guidelines yet.  We discussed that screening with CA-125 blood tests and transvaginal ultrasounds are not generally recommended for ovarian cancer screening because these tests have not been shown to detect ovarian cancer at an early stage. Women have a choice of having a BSO, but again, this is not reflected in current guidelines.  FAMILY MEMBERS: It is important that all of [name]'s relatives know of the presence of this gene mutation. Site-specific genetic testing can sort out who in the family is at risk and who is not. It is strongly recommended that relatives pursue genetic counseling prior to any testing to understand the limited implications of this information.  [name] three sons and brother are at 50% risk to have inherited this mutation. However, since it is not known whether men with a BRIP1 mutation have an increased risk of cancer, her brother should not feel any urgency to get tested. Testing for a single BRIP1 mutation is not recommended in childhood.   We encouraged [name] to remain in contact with [place] on an annual basis so we can update her personal and family histories, and let her know of advances in cancer genetics that may benefit the family. Our contact number was provided. [name] questions were answered to her satisfaction today, and she knows she is welcome to call anytime with additional questions.    [name], [place], [place] Certified Genetic Counselor Phone: [phone] [name]

## 2014-02-11 ENCOUNTER — Other Ambulatory Visit: Payer: Self-pay

## 2014-02-28 ENCOUNTER — Encounter: Payer: Self-pay | Admitting: Genetic Counselor

## 2014-04-11 ENCOUNTER — Other Ambulatory Visit: Payer: Self-pay | Admitting: Obstetrics and Gynecology

## 2014-04-13 NOTE — Patient Instructions (Addendum)
   Your procedure is scheduled on:  Friday, Dec 18  Enter through the [employer] [facility] at: 8:15 AM Pick up the phone at the desk and dial [phone] and inform us of your arrival.  Please call this number if you have any problems the morning of surgery: [phone]  Remember: Do not eat or drink after midnight:  tonight Take these medicines the morning of surgery with a SIP OF WATER:  None  Do not wear jewelry, make-up, or FINGER nail polish No metal in your hair or on your body. Do not wear lotions, powders, perfumes.  You may wear deodorant.  Do not bring valuables to the hospital. Contacts, dentures or bridgework may not be worn into surgery.  Patients discharged on the day of surgery will not be allowed to drive home.  Home with husband [name] cell [phone]

## 2014-04-14 ENCOUNTER — Encounter (HOSPITAL_COMMUNITY)
Admission: RE | Admit: 2014-04-14 | Discharge: 2014-04-14 | Disposition: A | Discharge status: Home / Self Care | Payer: 59 | Source: Ambulatory Visit | Attending: Obstetrics and Gynecology | Admitting: Obstetrics and Gynecology

## 2014-04-14 ENCOUNTER — Other Ambulatory Visit (HOSPITAL_COMMUNITY): Payer: Self-pay

## 2014-04-14 ENCOUNTER — Encounter (HOSPITAL_COMMUNITY): Payer: Self-pay

## 2014-04-14 DIAGNOSIS — D649 Anemia, unspecified: Secondary | ICD-10-CM | POA: Diagnosis not present

## 2014-04-14 DIAGNOSIS — Z853 Personal history of malignant neoplasm of breast: Secondary | ICD-10-CM | POA: Diagnosis not present

## 2014-04-14 DIAGNOSIS — N8320 Unspecified ovarian cysts: Secondary | ICD-10-CM | POA: Diagnosis not present

## 2014-04-14 DIAGNOSIS — Z8543 Personal history of malignant neoplasm of ovary: Secondary | ICD-10-CM | POA: Diagnosis not present

## 2014-04-14 LAB — CBC
HCT: 36.7 % (ref 36.0–46.0)
HEMOGLOBIN: 12.4 g/dL (ref 12.0–15.0)
MCH: 30 pg (ref 26.0–34.0)
MCHC: 33.8 g/dL (ref 30.0–36.0)
MCV: 88.6 fL (ref 78.0–100.0)
Platelets: 324 10*3/uL (ref 150–400)
RBC: 4.14 MIL/uL (ref 3.87–5.11)
RDW: 12.4 % (ref 11.5–15.5)
WBC: 7.8 10*3/uL (ref 4.0–10.5)

## 2014-04-15 ENCOUNTER — Ambulatory Visit (HOSPITAL_COMMUNITY): Payer: 59 | Admitting: Anesthesiology

## 2014-04-15 ENCOUNTER — Encounter (HOSPITAL_COMMUNITY)
Admission: RE | Disposition: A | Discharge status: Home / Self Care | Payer: Self-pay | Source: Ambulatory Visit | Attending: Obstetrics and Gynecology

## 2014-04-15 ENCOUNTER — Ambulatory Visit (HOSPITAL_COMMUNITY)
Admission: RE | Admit: 2014-04-15 | Discharge: 2014-04-15 | Disposition: A | Discharge status: Home / Self Care | Payer: 59 | Source: Ambulatory Visit | Attending: Obstetrics and Gynecology | Admitting: Obstetrics and Gynecology

## 2014-04-15 DIAGNOSIS — Z853 Personal history of malignant neoplasm of breast: Secondary | ICD-10-CM | POA: Insufficient documentation

## 2014-04-15 DIAGNOSIS — D270 Benign neoplasm of right ovary: Secondary | ICD-10-CM

## 2014-04-15 DIAGNOSIS — Z8543 Personal history of malignant neoplasm of ovary: Secondary | ICD-10-CM | POA: Insufficient documentation

## 2014-04-15 DIAGNOSIS — Z9889 Other specified postprocedural states: Secondary | ICD-10-CM

## 2014-04-15 DIAGNOSIS — D649 Anemia, unspecified: Secondary | ICD-10-CM | POA: Insufficient documentation

## 2014-04-15 DIAGNOSIS — N8320 Unspecified ovarian cysts: Secondary | ICD-10-CM | POA: Diagnosis not present

## 2014-04-15 DIAGNOSIS — D271 Benign neoplasm of left ovary: Secondary | ICD-10-CM

## 2014-04-15 DIAGNOSIS — Z8742 Personal history of other diseases of the female genital tract: Secondary | ICD-10-CM

## 2014-04-15 HISTORY — PX: ROBOTIC ASSISTED LAPAROSCOPIC OVARIAN CYSTECTOMY: SHX6081

## 2014-04-15 SURGERY — ROBOTIC ASSISTED LAPAROSCOPIC OVARIAN CYSTECTOMY
Anesthesia: General | Site: Abdomen | Laterality: Bilateral

## 2014-04-15 MED ORDER — HYDROCODONE-ACETAMINOPHEN 5-325 MG PO TABS: 1.0000 | ORAL_TABLET | Freq: Four times a day (QID) | ORAL | Status: AC | PRN

## 2014-04-15 MED ORDER — KETOROLAC TROMETHAMINE 30 MG/ML IJ SOLN
INTRAMUSCULAR | Status: AC
  Filled 2014-04-15: qty 2

## 2014-04-15 MED ORDER — EPHEDRINE SULFATE 50 MG/ML IJ SOLN
INTRAMUSCULAR | Status: DC | PRN
  Administered 2014-04-15: 10 mg via INTRAVENOUS

## 2014-04-15 MED ORDER — HYDROMORPHONE HCL 1 MG/ML IJ SOLN
INTRAMUSCULAR | Status: DC | PRN
  Administered 2014-04-15: 1 mg via INTRAVENOUS

## 2014-04-15 MED ORDER — GLYCOPYRROLATE 0.2 MG/ML IJ SOLN
INTRAMUSCULAR | Status: DC | PRN
  Administered 2014-04-15: 0.6 mg via INTRAVENOUS

## 2014-04-15 MED ORDER — SODIUM CHLORIDE 0.9 % IJ SOLN
INTRAMUSCULAR | Status: AC
  Filled 2014-04-15: qty 50

## 2014-04-15 MED ORDER — SCOPOLAMINE 1 MG/3DAYS TD PT72
1.0000 | MEDICATED_PATCH | Freq: Once | TRANSDERMAL | Status: DC
  Administered 2014-04-15: 1.5 mg via TRANSDERMAL

## 2014-04-15 MED ORDER — HEPARIN SODIUM (PORCINE) 5000 UNIT/ML IJ SOLN
INTRAMUSCULAR | Status: AC
  Filled 2014-04-15: qty 1

## 2014-04-15 MED ORDER — ROPIVACAINE HCL 5 MG/ML IJ SOLN
INTRAMUSCULAR | Status: AC
  Filled 2014-04-15: qty 60

## 2014-04-15 MED ORDER — FENTANYL CITRATE 0.05 MG/ML IJ SOLN
INTRAMUSCULAR | Status: DC | PRN
  Administered 2014-04-15 (×2): 100 ug via INTRAVENOUS
  Administered 2014-04-15: 50 ug via INTRAVENOUS

## 2014-04-15 MED ORDER — HYDROMORPHONE HCL 1 MG/ML IJ SOLN
INTRAMUSCULAR | Status: AC
  Filled 2014-04-15: qty 1

## 2014-04-15 MED ORDER — LACTATED RINGERS IR SOLN
Status: DC | PRN
Start: 2014-04-15 — End: 2014-04-15
  Administered 2014-04-15: 3000 mL

## 2014-04-15 MED ORDER — MIDAZOLAM HCL 2 MG/2ML IJ SOLN
INTRAMUSCULAR | Status: AC
  Filled 2014-04-15: qty 2

## 2014-04-15 MED ORDER — NEOSTIGMINE METHYLSULFATE 10 MG/10ML IV SOLN
INTRAVENOUS | Status: DC | PRN
  Administered 2014-04-15: 4 mg via INTRAVENOUS

## 2014-04-15 MED ORDER — FENTANYL CITRATE 0.05 MG/ML IJ SOLN
INTRAMUSCULAR | Status: AC
  Filled 2014-04-15: qty 2

## 2014-04-15 MED ORDER — BUPIVACAINE HCL (PF) 0.25 % IJ SOLN
INTRAMUSCULAR | Status: DC | PRN
  Administered 2014-04-15: 7 mL

## 2014-04-15 MED ORDER — DEXAMETHASONE SODIUM PHOSPHATE 10 MG/ML IJ SOLN
INTRAMUSCULAR | Status: DC | PRN
  Administered 2014-04-15: 10 mg via INTRAVENOUS

## 2014-04-15 MED ORDER — BUPIVACAINE HCL (PF) 0.25 % IJ SOLN
INTRAMUSCULAR | Status: AC
  Filled 2014-04-15: qty 60

## 2014-04-15 MED ORDER — HYDROMORPHONE HCL 1 MG/ML IJ SOLN: 0.2500 mg | INTRAMUSCULAR | Status: DC | PRN

## 2014-04-15 MED ORDER — PROPOFOL 10 MG/ML IV BOLUS
INTRAVENOUS | Status: DC | PRN
  Administered 2014-04-15: 150 mg via INTRAVENOUS

## 2014-04-15 MED ORDER — SCOPOLAMINE 1 MG/3DAYS TD PT72
MEDICATED_PATCH | TRANSDERMAL | Status: AC
  Filled 2014-04-15: qty 1

## 2014-04-15 MED ORDER — MIDAZOLAM HCL 2 MG/2ML IJ SOLN
INTRAMUSCULAR | Status: DC | PRN
  Administered 2014-04-15: 2 mg via INTRAVENOUS

## 2014-04-15 MED ORDER — PROMETHAZINE HCL 25 MG/ML IJ SOLN
INTRAMUSCULAR | Status: AC
  Filled 2014-04-15: qty 1

## 2014-04-15 MED ORDER — LACTATED RINGERS IV SOLN
INTRAVENOUS | Status: DC
  Administered 2014-04-15 (×3): via INTRAVENOUS

## 2014-04-15 MED ORDER — SODIUM CHLORIDE 0.9 % IJ SOLN
INTRAMUSCULAR | Status: AC
  Filled 2014-04-15: qty 10

## 2014-04-15 MED ORDER — LIDOCAINE HCL (CARDIAC) 20 MG/ML IV SOLN
INTRAVENOUS | Status: DC | PRN
  Administered 2014-04-15: 100 mg via INTRAVENOUS

## 2014-04-15 MED ORDER — LIDOCAINE HCL (CARDIAC) 20 MG/ML IV SOLN
INTRAVENOUS | Status: AC
  Filled 2014-04-15: qty 5

## 2014-04-15 MED ORDER — FENTANYL CITRATE 0.05 MG/ML IJ SOLN
INTRAMUSCULAR | Status: AC
  Filled 2014-04-15: qty 5

## 2014-04-15 MED ORDER — ONDANSETRON HCL 4 MG/2ML IJ SOLN
INTRAMUSCULAR | Status: DC | PRN
Start: 2014-04-15 — End: 2014-04-15
  Administered 2014-04-15: 4 mg via INTRAVENOUS

## 2014-04-15 MED ORDER — PROPOFOL 10 MG/ML IV EMUL
INTRAVENOUS | Status: AC
  Filled 2014-04-15: qty 20

## 2014-04-15 MED ORDER — ROCURONIUM BROMIDE 100 MG/10ML IV SOLN
INTRAVENOUS | Status: AC
  Filled 2014-04-15: qty 1

## 2014-04-15 MED ORDER — MEPERIDINE HCL 25 MG/ML IJ SOLN: 6.2500 mg | INTRAMUSCULAR | Status: DC | PRN

## 2014-04-15 MED ORDER — PROMETHAZINE HCL 25 MG/ML IJ SOLN
6.2500 mg | INTRAMUSCULAR | Status: DC | PRN
  Administered 2014-04-15: 6.25 mg via INTRAVENOUS

## 2014-04-15 MED ORDER — EPHEDRINE 5 MG/ML INJ
INTRAVENOUS | Status: AC
  Filled 2014-04-15: qty 10

## 2014-04-15 MED ORDER — ONDANSETRON HCL 4 MG/2ML IJ SOLN
INTRAMUSCULAR | Status: AC
  Filled 2014-04-15: qty 2

## 2014-04-15 MED ORDER — ROCURONIUM BROMIDE 100 MG/10ML IV SOLN
INTRAVENOUS | Status: DC | PRN
  Administered 2014-04-15: 30 mg via INTRAVENOUS
  Administered 2014-04-15: 20 mg via INTRAVENOUS

## 2014-04-15 MED ORDER — MIDAZOLAM HCL 2 MG/2ML IJ SOLN: 0.5000 mg | Freq: Once | INTRAMUSCULAR | Status: DC | PRN

## 2014-04-15 MED ORDER — NEOSTIGMINE METHYLSULFATE 10 MG/10ML IV SOLN
INTRAVENOUS | Status: AC
Start: 2014-04-15 — End: 2014-04-15
  Filled 2014-04-15: qty 1

## 2014-04-15 MED ORDER — DEXAMETHASONE SODIUM PHOSPHATE 10 MG/ML IJ SOLN
INTRAMUSCULAR | Status: AC
  Filled 2014-04-15: qty 1

## 2014-04-15 MED ORDER — GLYCOPYRROLATE 0.2 MG/ML IJ SOLN
INTRAMUSCULAR | Status: AC
  Filled 2014-04-15: qty 3

## 2014-04-15 MED ORDER — KETOROLAC TROMETHAMINE 30 MG/ML IJ SOLN
INTRAMUSCULAR | Status: DC | PRN
  Administered 2014-04-15 (×2): 30 mg via INTRAVENOUS

## 2014-04-15 SURGICAL SUPPLY — 72 items
APPLICATOR COTTON TIP 6IN STRL (MISCELLANEOUS) ×4 IMPLANT
BAG SPEC RTRVL LRG 6X4 10 (ENDOMECHANICALS) ×2
BARRIER ADHS 3X4 INTERCEED (GAUZE/BANDAGES/DRESSINGS) ×4 IMPLANT
BRR ADH 4X3 ABS CNTRL BYND (GAUZE/BANDAGES/DRESSINGS) ×2
CABLE HIGH FREQUENCY MONO STRZ (ELECTRODE) IMPLANT
CATH FOLEY 3WAY  5CC 16FR (CATHETERS) ×2
CATH FOLEY 3WAY 5CC 16FR (CATHETERS) ×2 IMPLANT
CATH ROBINSON RED A/P 16FR (CATHETERS) ×4 IMPLANT
CHLORAPREP W/TINT 26ML (MISCELLANEOUS) ×4 IMPLANT
CLOTH BEACON ORANGE TIMEOUT ST (SAFETY) ×4 IMPLANT
CONT PATH 16OZ SNAP LID 3702 (MISCELLANEOUS) ×4 IMPLANT
COVER BACK TABLE 60X90IN (DRAPES) ×8 IMPLANT
COVER TIP SHEARS 8 DVNC (MISCELLANEOUS) ×2 IMPLANT
COVER TIP SHEARS 8MM DA VINCI (MISCELLANEOUS) ×2
DECANTER SPIKE VIAL GLASS SM (MISCELLANEOUS) ×4 IMPLANT
DEVICE TROCAR PUNCTURE CLOSURE (ENDOMECHANICALS) IMPLANT
DRAPE WARM FLUID 44X44 (DRAPE) ×4 IMPLANT
DRSG COVADERM PLUS 2X2 (GAUZE/BANDAGES/DRESSINGS) ×16 IMPLANT
DRSG OPSITE POSTOP 3X4 (GAUZE/BANDAGES/DRESSINGS) ×4 IMPLANT
ELECT LIGASURE LONG (ELECTRODE) IMPLANT
ELECT REM PT RETURN 9FT ADLT (ELECTROSURGICAL) ×4
ELECTRODE REM PT RTRN 9FT ADLT (ELECTROSURGICAL) ×2 IMPLANT
EVACUATOR SMOKE 8.L (FILTER) ×4 IMPLANT
FORCEPS CUTTING 33CM 5MM (CUTTING FORCEPS) IMPLANT
FORCEPS CUTTING 45CM 5MM (CUTTING FORCEPS) IMPLANT
GAUZE VASELINE 3X9 (GAUZE/BANDAGES/DRESSINGS) IMPLANT
GLOVE BIOGEL PI IND STRL 7.0 (GLOVE) ×4 IMPLANT
GLOVE BIOGEL PI INDICATOR 7.0 (GLOVE) ×4
GLOVE ECLIPSE 6.5 STRL STRAW (GLOVE) ×4 IMPLANT
GOWN STRL REUS W/TWL LRG LVL3 (GOWN DISPOSABLE) ×32 IMPLANT
KIT ACCESSORY DA VINCI DISP (KITS) ×2
KIT ACCESSORY DVNC DISP (KITS) ×2 IMPLANT
LIQUID BAND (GAUZE/BANDAGES/DRESSINGS) ×4 IMPLANT
NEEDLE INSUFFLATION 120MM (ENDOMECHANICALS) ×4 IMPLANT
NS IRRIG 1000ML POUR BTL (IV SOLUTION) ×12 IMPLANT
OCCLUDER COLPOPNEUMO (BALLOONS) IMPLANT
PACK LAPAROSCOPY BASIN (CUSTOM PROCEDURE TRAY) ×4 IMPLANT
PACK ROBOT WH (CUSTOM PROCEDURE TRAY) ×4 IMPLANT
PAD PREP 24X48 CUFFED NSTRL (MISCELLANEOUS) ×8 IMPLANT
PAD TRENDELENBURG OR TABLE (MISCELLANEOUS) ×4 IMPLANT
PAD TRENDELENBURG POSITION (MISCELLANEOUS) ×4 IMPLANT
POUCH INSTRUMENT 3X5 (STERILIZATION PRODUCTS) ×3 IMPLANT
POUCH SPECIMEN RETRIEVAL 10MM (ENDOMECHANICALS) ×3 IMPLANT
PROTECTOR NERVE ULNAR (MISCELLANEOUS) ×8 IMPLANT
SCISSORS LAP 5X35 DISP (ENDOMECHANICALS) IMPLANT
SET CYSTO W/LG BORE CLAMP LF (SET/KITS/TRAYS/PACK) IMPLANT
SET IRRIG TUBING LAPAROSCOPIC (IRRIGATION / IRRIGATOR) ×4 IMPLANT
SET TRI-LUMEN FLTR TB AIRSEAL (TUBING) ×3 IMPLANT
SOLUTION ELECTROLUBE (MISCELLANEOUS) IMPLANT
SUT VIC AB 0 CT1 27 (SUTURE) ×20
SUT VIC AB 0 CT1 27XBRD ANTBC (SUTURE) ×10 IMPLANT
SUT VICRYL 0 UR6 27IN ABS (SUTURE) ×4 IMPLANT
SUT VICRYL 4-0 PS2 18IN ABS (SUTURE) ×8 IMPLANT
SYR 50ML LL SCALE MARK (SYRINGE) ×4 IMPLANT
SYSTEM CONVERTIBLE TROCAR (TROCAR) IMPLANT
TIP UTERINE 5.1X6CM LAV DISP (MISCELLANEOUS) IMPLANT
TIP UTERINE 6.7X10CM GRN DISP (MISCELLANEOUS) IMPLANT
TIP UTERINE 6.7X6CM WHT DISP (MISCELLANEOUS) IMPLANT
TIP UTERINE 6.7X8CM BLUE DISP (MISCELLANEOUS) IMPLANT
TOWEL OR 17X24 6PK STRL BLUE (TOWEL DISPOSABLE) ×12 IMPLANT
TRAY FOLEY BAG SILVER LF 16FR (CATHETERS) ×4 IMPLANT
TROCAR 12M 150ML BLUNT (TROCAR) IMPLANT
TROCAR BALLN 12MMX100 BLUNT (TROCAR) IMPLANT
TROCAR DISP BLADELESS 8 DVNC (TROCAR) ×2 IMPLANT
TROCAR DISP BLADELESS 8MM (TROCAR) ×2
TROCAR OPTI TIP 12M 100M (ENDOMECHANICALS) ×4 IMPLANT
TROCAR OPTI TIP 5M 100M (ENDOMECHANICALS) ×8 IMPLANT
TROCAR PORT AIRSEAL 5X120 (TROCAR) ×3 IMPLANT
TROCAR XCEL DIL TIP R 11M (ENDOMECHANICALS) ×4 IMPLANT
TROCAR Z-THREAD 12X150 (TROCAR) ×4 IMPLANT
WARMER LAPAROSCOPE (MISCELLANEOUS) ×4 IMPLANT
WATER STERILE IRR 1000ML POUR (IV SOLUTION) ×4 IMPLANT

## 2014-04-15 NOTE — Brief Op Note (Signed)
04/15/2014  12:58 PM  PATIENT:  [name]  38 y.o. female  PRE-OPERATIVE DIAGNOSIS:  Large left simple ovarian cyst, right complex ovarian cysts? dermoid  POST-OPERATIVE DIAGNOSIS:  Bilateral Ovarian Dermoid cysts  PROCEDURE:  Procedure(s): ROBOTIC ASSISTED Bilateral LAPAROSCOPIC OVARIAN CYSTECTOMY (Bilateral)  SURGEON:  Surgeon(s) and Role:    * Amarianna Abplanalp A Walther Sanagustin, MD - Primary  PHYSICIAN ASSISTANT:   ASSISTANTS: Artelia Laroche, CNM   ANESTHESIA:   local and general Findings: 8 cm large ovarian dermoid( multiseptated), right ovarian dermoid x 2, right ovarian adhesion to right ant wall, nl liver edge, nl gallbladder Nl uterus  EBL:  Total I/O In: 1400 [I.V.:1400] Out: 450 [Urine:450]  BLOOD ADMINISTERED:none  DRAINS: none   LOCAL MEDICATIONS USED:  MARCAINE     SPECIMEN:  Source of Specimen:  left ovarian cyst, right ovarian dermoid cyst x 2  DISPOSITION OF SPECIMEN:  PATHOLOGY  COUNTS:  YES  TOURNIQUET:  * No tourniquets in log *  DICTATION: .Other Dictation: Dictation Number [id number]  PLAN OF CARE: Discharge to home after PACU  PATIENT DISPOSITION:  PACU - hemodynamically stable.   Delay start of Pharmacological VTE agent (>24hrs) due to surgical blood loss or risk of bleeding: no

## 2014-04-15 NOTE — H&P (Signed)
[name] is an 38 y.o. female.[id number] MWF present for surgical management of persistent large 8 cm simple ovarian cyst, right complex ovarian cysts ? Dermoids. Pt is scheduled for dx laparoscopy, bilateral ovarian cystectomy, poss davinci robotic bilateral ovarian cystectomy.  Pertinent Gynecological History: Menses: regular every month without intermenstrual spotting Bleeding: reg Contraception: condoms DES exposure: denies Blood transfusions: none Sexually transmitted diseases: no past history Previous GYN Procedures: DNC and c/s  Last mammogram: n/a Date: n/a Last pap: normal Date: 01/17/2014 OB History: G6, P3   Menstrual History: Menarche age: n/a No LMP recorded.    Past Medical History  Diagnosis Date  . Abnormal Pap smear   . Anemia   . Routine postpartum follow-up 12/16/2010  . Postpartum care following vaginal delivery (10/24/12) 10/25/2012  . VBAC, delivered 10/25/2012  . Family history of malignant neoplasm of breast   . Family history of malignant neoplasm of ovary   . SVD (spontaneous vaginal delivery)     x 2    Past Surgical History  Procedure Laterality Date  . Cesarean section  2009    x 1  . Wisdom tooth extraction    . Dilation and curettage of uterus    . Leep    . Dilation and curettage of uterus  2008    Family History  Problem Relation Age of Onset  . Heart disease Mother   . Rectal cancer Mother 6  . Skin cancer Mother   . Other Mother     BRIP1 mutation (p.R798*)  . Hypertension Father   . Hyperlipidemia Father   . Skin cancer Father 31    deceased 95  . Breast cancer Maternal Aunt 52  . Ovarian cancer Maternal Aunt 48  . Lymphoma Maternal Uncle     deceased 72; Agent Orange exp.  . Lung cancer Maternal Uncle     deceased 31; smoker    Social History:  reports that she has never smoked. She has never used smokeless tobacco. She reports that she drinks alcohol. She reports that she does not use illicit drugs.  Allergies: No  Known Allergies  No prescriptions prior to admission    Review of Systems  All other systems reviewed and are negative.   currently breastfeeding. Physical Exam  Constitutional: She is oriented to person, place, and time. She appears well-developed and well-nourished.  HENT:  Head: Atraumatic.  Eyes: EOM are normal.  Neck: Neck supple.  Cardiovascular: Regular rhythm.   Respiratory: Breath sounds normal.  GI: Soft.  Neurological: She is alert and oriented to person, place, and time.  Skin: Skin is warm and dry.  Psychiatric: She has a normal mood and affect.    Results for orders placed or performed during the hospital encounter of 04/14/14 (from the past 24 hour(s))  CBC     Status: None   Collection Time: 04/14/14  2:17 PM  Result Value Ref Range   WBC 7.8 4.0 - 10.5 K/uL   RBC 4.14 3.87 - 5.11 MIL/uL   Hemoglobin 12.4 12.0 - 15.0 g/dL   HCT 36.7 36.0 - 46.0 %   MCV 88.6 78.0 - 100.0 fL   MCH 30.0 26.0 - 34.0 pg   MCHC 33.8 30.0 - 36.0 g/dL   RDW 12.4 11.5 - 15.5 %   Platelets 324 150 - 400 K/uL    No results found.  Assessment/Plan: Right complex ovarian cysts Left large simple ovarian cyst P) dx laparoscopy, bilateral ovarian cystectomy, possible davinci robotic assistance. Procedure explained.  Risk of surgery reviewed including infection, bleeding, injury to surrounding organ structures, poss loss of ovary  Nissi Doffing A 04/15/2014, 1:48 AM

## 2014-04-15 NOTE — Anesthesia Postprocedure Evaluation (Signed)
  Anesthesia Post-op Note  Patient: [name]  Procedure(s) Performed: Procedure(s): ROBOTIC ASSISTED Bilateral LAPAROSCOPIC OVARIAN CYSTECTOMY (Bilateral) Patient is awake and responsive. Pain and nausea are reasonably well controlled. Vital signs are stable and clinically acceptable. Oxygen saturation is clinically acceptable. There are no apparent anesthetic complications at this time. Patient is ready for discharge.

## 2014-04-15 NOTE — Transfer of Care (Signed)
Immediate Anesthesia Transfer of Care Note  Patient: [name]  Procedure(s) Performed: Procedure(s): ROBOTIC ASSISTED Bilateral LAPAROSCOPIC OVARIAN CYSTECTOMY (Bilateral)  Patient Location: PACU  Anesthesia Type:General  Level of Consciousness: awake, alert  and oriented  Airway & Oxygen Therapy: Patient Spontanous Breathing and Patient connected to nasal cannula oxygen  Post-op Assessment: Report given to PACU RN and Post -op Vital signs reviewed and stable  Post vital signs: Reviewed and stable  Complications: No apparent anesthesia complications

## 2014-04-15 NOTE — Anesthesia Preprocedure Evaluation (Addendum)
Anesthesia Evaluation  Patient identified by MRN, date of birth, ID band Patient awake    Reviewed: Allergy & Precautions, H&P , Patient's Chart, lab work & pertinent test results, reviewed documented beta blocker date and time   History of Anesthesia Complications Negative for: history of anesthetic complications  Airway Mallampati: II  TM Distance: >3 FB Neck ROM: full    Dental   Pulmonary  breath sounds clear to auscultation        Cardiovascular Exercise Tolerance: Good Rhythm:regular Rate:Normal     Neuro/Psych    GI/Hepatic   Endo/Other    Renal/GU      Musculoskeletal   Abdominal   Peds  Hematology  (+) anemia ,   Anesthesia Other Findings   Reproductive/Obstetrics                             Anesthesia Physical Anesthesia Plan  ASA: I  Anesthesia Plan: General ETT   Post-op Pain Management:    Induction:   Airway Management Planned:   Additional Equipment:   Intra-op Plan:   Post-operative Plan:   Informed Consent: I have reviewed the patients History and Physical, chart, labs and discussed the procedure including the risks, benefits and alternatives for the proposed anesthesia with the patient or authorized representative who has indicated his/her understanding and acceptance.   Dental Advisory Given  Plan Discussed with: CRNA and Surgeon  Anesthesia Plan Comments:        Anesthesia Quick Evaluation

## 2014-04-15 NOTE — Anesthesia Procedure Notes (Signed)
Procedure Name: Intubation Date/Time: 04/15/2014 9:45 AM Performed by: Meliton Samad, Sheron Nightingale Pre-anesthesia Checklist: Patient identified, Timeout performed, Emergency Drugs available, Suction available and Patient being monitored Patient Re-evaluated:Patient Re-evaluated prior to inductionOxygen Delivery Method: Circle system utilized Preoxygenation: Pre-oxygenation with 100% oxygen Intubation Type: IV induction Ventilation: Mask ventilation without difficulty Laryngoscope Size: Mac and 3 Grade View: Grade I Tube type: Oral Number of attempts: 1 Placement Confirmation: ETT inserted through vocal cords under direct vision,  breath sounds checked- equal and bilateral and positive ETCO2 Secured at: 22 cm Dental Injury: Teeth and Oropharynx as per pre-operative assessment

## 2014-04-15 NOTE — Discharge Instructions (Signed)

## 2014-04-16 NOTE — Op Note (Signed)
[name], [name]              ACCOUNT NO.:  [id number]  MEDICAL RECORD NO.:  [id number]  LOCATION:  [facility]                          FACILITY:  [facility]  PHYSICIAN:  Servando Salina, M.D.DATE OF BIRTH:  01-31-1976  DATE OF PROCEDURE:  04/15/2014 DATE OF DISCHARGE:  04/15/2014                              OPERATIVE REPORT   PREOPERATIVE DIAGNOSES:  Large left simple ovarian cyst.  Right complex ovarian cyst x 2, possible dermoid cysts.  PROCEDURE:  Da Vinci robotic bilateral ovarian cystectomy.  POSTOPERATIVE DIAGNOSIS:  Bilateral ovarian dermoid cysts.  ANESTHESIA:  General.  SURGEON:  Servando Salina, MD  ASSISTANT:  Artelia Laroche, CNM  DESCRIPTION OF PROCEDURE:  Under adequate general anesthesia, the patient was placed in dorsal lithotomy position.  She was positioned for robotic surgery.  An indwelling Foley catheter was sterilely placed. She was sterilely prepped and draped in usual fashion.  A bivalve speculum was placed in the vagina.  Single-tooth tenaculum was placed on the anterior lip of the cervix.  An acorn cannula was introduced into the cervical os and attached to the tenaculum for manipulation of the uterus.  The bivalve speculum was then removed.  Attention was then turned to the abdomen.  The patient has a short trunk, therefore a supraumbilical incision was made after 0.25% Marcaine was injected at the site. Veress needle was introduced and tested.  Opening pressure of 7 was noted.  3.5 L of CO2 was insufflated.  Veress needle was then removed.  A 12-mm disposable trocar with sleeve was introduced into the abdomen without incident.  The robotic camera was introduced through that port.  It confirmed entry into the abdomen without incident.  Panoramic inspection showed a normal liver edge.  The gallbladder was seen.  There was evidence of the right ovary had an adhesion that stretched to the right lower anterior abdominal wall.  The patient was placed in a  Trendelenburg position. The uterus appeared normal.  The left ovary had adhesions to the left pelvic sidewall but had encompassed the entire posterior cul-de-sac due to its large size and containing a cystic mass.  Additional port sites were then placed 2 on the left, 8-mm robotic, 1 on the right 8-mm robotic, and a right 5-mm assistant port.  The robot was then docked to the patient's left side. In arm #1 monopolar scissors, arm #2 PK dissector, and arm #3 the Pro grasp were placed.   I then went to the surgical console. At the surgical console, the pelvis was further inspected.  Using the third arm, the large 8 cm left cystic mass was brought up in the field, and the uterus was then displaced posteriorly for support of the mass.  Using cautery, a linear incision was made overlying the ovarian cyst at which time, it incidentally ruptured, copious amount of clear fluid was initially noted.  Incision was then extended and once good size opening was made, it was then noted to have blonde hair within it confirming a dermoid cyst.  With a combination of traction by the assistant and the third arm, the cyst wall was opened and the cyst wall was then systematically removed at that point with the dermoid  having some sebaceous material within it as well.  This sebaceous material was  contained as much as possible.  Below that was another cyst wall which incidentally also ruptured, and after manipulation, all the cystic portion of this mass was entirely removed.  Small bleeding along the edge of the ovary was then cauterized.  Decision was then made to remove this cyst wall and its content now than later so as to avoid spreading the dermoid material in the abdominal cavity.  At that point, the 8-mm scope was placed in the right side port and through the supraumbilical site the Endobag was then inserted and that whole cystic mass with dermoid material was then placed in that bag and brought up through the  supraumbilical site.  The abdomen was then irrigated and suctioned.  Fluid that had been previously ruptured was also suctioned as well.  We reinstituted the camera port at the supraumbilical site and then addressed the right side.  The right ovarian  adhesion was lysed from the right lateral anterior wall.  The ovary clearly had cystic masses.  It was stabilized using the third arm, and initial incision overlying what appeared to be a cystic area resulted in corpus luteal material being seen.  Another incision was then made more distally and resulted in dermoid fluid/sebaceous material to be seen.  A plug of cystic mass was then removed with dissection.  Below that it was noted that there was another dermoid cyst by ultrasound, and therefore on external inspection it appeared to be closer to the hilum, and decision was then made to go through the current incision and approach it from that end.  In doing so, that additional dermoid cyst was also carefully removed at least 2 to 2.5 cm mass.  The base was then carefully cauterized and bleeders cauterized as well.  Additional small material from the cyst walls was also removed.  When all was felt to be removed, the opened area was irrigated, small bleeders cauterized and this was continued until it was felt that all hemostasis had been achieved.  Again, the endobag was then reinserted using again the supraumbilical site.  All the materials were then placed and it was removed.  Re-docking was then accomplished, and the pelvis was then irrigated, inspected.  No endometriosis was noted in the anterior or posterior cul-de-sac.  Hemostasis was achieved on both sides, and the procedure was felt to be complete.  At that point, the robotic instruments were removed.  The robot was undocked and I went back to the patient's bedside.  Upper quadrant fluids were removed.  Abdomen was copiously irrigated and suctioned of debris.  The abdomen had  been insufflated using the AirSeal, therefore all port sites were removed except for that one, and then the assistant port site was then removed and taken out of the pneumoperitoneum.  The incision sites were then closed with 4-0 Vicryl subcuticular stitch.  The patient being thin was able to clearly see the fascia.  At the supraumbilical site, a 0 Vicryl figure-of-eight suture was then carefully placed, and then skin incisions were closed with 4-0 Vicryl subcuticular sutures.  The instruments from the vagina were removed.  Foley was removed.  SPECIMEN:  Left ovarian cyst and right ovarian cyst x2 all sent to Pathology.  ESTIMATED BLOOD LOSS:  10 mL.  FLUIDS:  1400 mL.  URINE OUTPUT:  450 mL clear yellow urine.  COUNTS:  Sponge and instrument count x2 was correct.  COMPLICATION:  None.  The patient tolerated the procedure well, was transferred to recovery in stable condition.     Servando Salina, M.D.     [place]/MEDQ  D:  04/16/2014  T:  04/16/2014  Job:  [id number]

## 2014-04-19 ENCOUNTER — Encounter (HOSPITAL_COMMUNITY): Payer: Self-pay | Admitting: Obstetrics and Gynecology

## 2016-05-17 ENCOUNTER — Other Ambulatory Visit: Payer: Self-pay | Admitting: Obstetrics and Gynecology

## 2016-05-17 DIAGNOSIS — Z1501 Genetic susceptibility to malignant neoplasm of breast: Secondary | ICD-10-CM

## 2016-05-17 DIAGNOSIS — Z1509 Genetic susceptibility to other malignant neoplasm: Principal | ICD-10-CM

## 2016-06-01 ENCOUNTER — Ambulatory Visit
Admission: RE | Admit: 2016-06-01 | Discharge: 2016-06-01 | Disposition: A | Discharge status: Home / Self Care | Payer: 59 | Source: Ambulatory Visit | Attending: Obstetrics and Gynecology | Admitting: Obstetrics and Gynecology

## 2016-06-01 DIAGNOSIS — Z1501 Genetic susceptibility to malignant neoplasm of breast: Secondary | ICD-10-CM

## 2016-06-01 DIAGNOSIS — Z1509 Genetic susceptibility to other malignant neoplasm: Principal | ICD-10-CM

## 2016-06-10 ENCOUNTER — Other Ambulatory Visit: Payer: Self-pay

## 2018-12-01 ENCOUNTER — Encounter: Payer: Self-pay | Admitting: Genetic Counselor
